# Patient Record
Sex: Male | Born: 1970 | Race: Black or African American | Hispanic: No | Marital: Single | Smoking: Never smoker
Health system: Southern US, Community
[De-identification: ages and names within clinical notes are randomized; demographics above are authoritative.]

## PROBLEM LIST (undated history)

## (undated) DIAGNOSIS — E039 Hypothyroidism, unspecified: Secondary | ICD-10-CM

## (undated) DIAGNOSIS — I10 Essential (primary) hypertension: Secondary | ICD-10-CM

## (undated) DIAGNOSIS — E119 Type 2 diabetes mellitus without complications: Secondary | ICD-10-CM

---

## 2013-10-02 ENCOUNTER — Emergency Department: Payer: Self-pay | Admitting: Emergency Medicine

## 2013-10-02 LAB — CBC
HCT: 39.4 % — AB (ref 40.0–52.0)
HGB: 13.2 g/dL (ref 13.0–18.0)
MCH: 26.2 pg (ref 26.0–34.0)
MCHC: 33.5 g/dL (ref 32.0–36.0)
MCV: 78 fL — ABNORMAL LOW (ref 80–100)
PLATELETS: 223 10*3/uL (ref 150–440)
RBC: 5.04 10*6/uL (ref 4.40–5.90)
RDW: 17.2 % — ABNORMAL HIGH (ref 11.5–14.5)
WBC: 11.7 10*3/uL — AB (ref 3.8–10.6)

## 2013-10-02 LAB — BASIC METABOLIC PANEL
Anion Gap: 2 — ABNORMAL LOW (ref 7–16)
BUN: 11 mg/dL (ref 7–18)
CALCIUM: 9 mg/dL (ref 8.5–10.1)
CHLORIDE: 104 mmol/L (ref 98–107)
Co2: 33 mmol/L — ABNORMAL HIGH (ref 21–32)
Creatinine: 1 mg/dL (ref 0.60–1.30)
EGFR (Non-African Amer.): >60
GLUCOSE: 100 mg/dL — AB (ref 65–99)
OSMOLALITY: 277 (ref 275–301)
Potassium: 3.5 mmol/L (ref 3.5–5.1)
Sodium: 139 mmol/L (ref 136–145)

## 2013-10-02 LAB — TROPONIN I: Troponin-I: <0.02 ng/mL

## 2013-10-09 ENCOUNTER — Emergency Department: Payer: Self-pay | Admitting: Emergency Medicine

## 2013-10-09 LAB — BASIC METABOLIC PANEL
Anion Gap: 7 (ref 7–16)
BUN: 9 mg/dL (ref 7–18)
CHLORIDE: 104 mmol/L (ref 98–107)
CO2: 31 mmol/L (ref 21–32)
Calcium, Total: 8.8 mg/dL (ref 8.5–10.1)
Creatinine: 1.01 mg/dL (ref 0.60–1.30)
EGFR (African American): >60
Glucose: 95 mg/dL (ref 65–99)
OSMOLALITY: 282 (ref 275–301)
Potassium: 4 mmol/L (ref 3.5–5.1)
Sodium: 142 mmol/L (ref 136–145)

## 2013-10-09 LAB — CBC
HCT: 41 % (ref 40.0–52.0)
HGB: 13 g/dL (ref 13.0–18.0)
MCH: 24.6 pg — AB (ref 26.0–34.0)
MCHC: 31.6 g/dL — ABNORMAL LOW (ref 32.0–36.0)
MCV: 78 fL — AB (ref 80–100)
PLATELETS: 230 10*3/uL (ref 150–440)
RBC: 5.27 10*6/uL (ref 4.40–5.90)
RDW: 16.9 % — ABNORMAL HIGH (ref 11.5–14.5)
WBC: 10.5 10*3/uL (ref 3.8–10.6)

## 2013-10-09 LAB — TROPONIN I
Troponin-I: <0.02 ng/mL
Troponin-I: <0.02 ng/mL

## 2013-10-26 ENCOUNTER — Ambulatory Visit: Payer: Self-pay | Admitting: Physician Assistant

## 2013-11-05 ENCOUNTER — Ambulatory Visit: Payer: Self-pay | Admitting: Internal Medicine

## 2014-11-18 ENCOUNTER — Emergency Department
Admit: 2014-11-18 | Disposition: A | Discharge status: Home / Self Care | Payer: Self-pay | Admitting: Emergency Medicine

## 2014-11-18 LAB — TROPONIN I: Troponin-I: <0.03 ng/mL

## 2014-11-18 LAB — COMPREHENSIVE METABOLIC PANEL
ALK PHOS: 64 U/L
ALT: 27 U/L
Albumin: 4.3 g/dL
Anion Gap: 7 (ref 7–16)
BILIRUBIN TOTAL: 1.2 mg/dL
BUN: 11 mg/dL
Calcium, Total: 9.3 mg/dL
Chloride: 103 mmol/L
Co2: 30 mmol/L
Creatinine: 0.96 mg/dL
EGFR (African American): >60
EGFR (Non-African Amer.): >60
GLUCOSE: 113 mg/dL — AB
Potassium: 3.9 mmol/L
SGOT(AST): 32 U/L
SODIUM: 140 mmol/L
Total Protein: 7.5 g/dL

## 2014-11-18 LAB — CBC
HCT: 41.8 % (ref 40.0–52.0)
HGB: 13.6 g/dL (ref 13.0–18.0)
MCH: 25.3 pg — AB (ref 26.0–34.0)
MCHC: 32.6 g/dL (ref 32.0–36.0)
MCV: 78 fL — ABNORMAL LOW (ref 80–100)
Platelet: 220 10*3/uL (ref 150–440)
RBC: 5.38 10*6/uL (ref 4.40–5.90)
RDW: 17.9 % — ABNORMAL HIGH (ref 11.5–14.5)
WBC: 9.1 10*3/uL (ref 3.8–10.6)

## 2014-11-18 IMAGING — CR DG CHEST 2V
1 series · 2 of 2 positions shown · non-contrast
Comparison: October 02, 2013

CLINICAL DATA: Chest pain

EXAM:
CHEST  2 VIEW

[Series 1: w chest pa · 0.14mm/px · 2 of 2 slices shown]
[im 1/2]
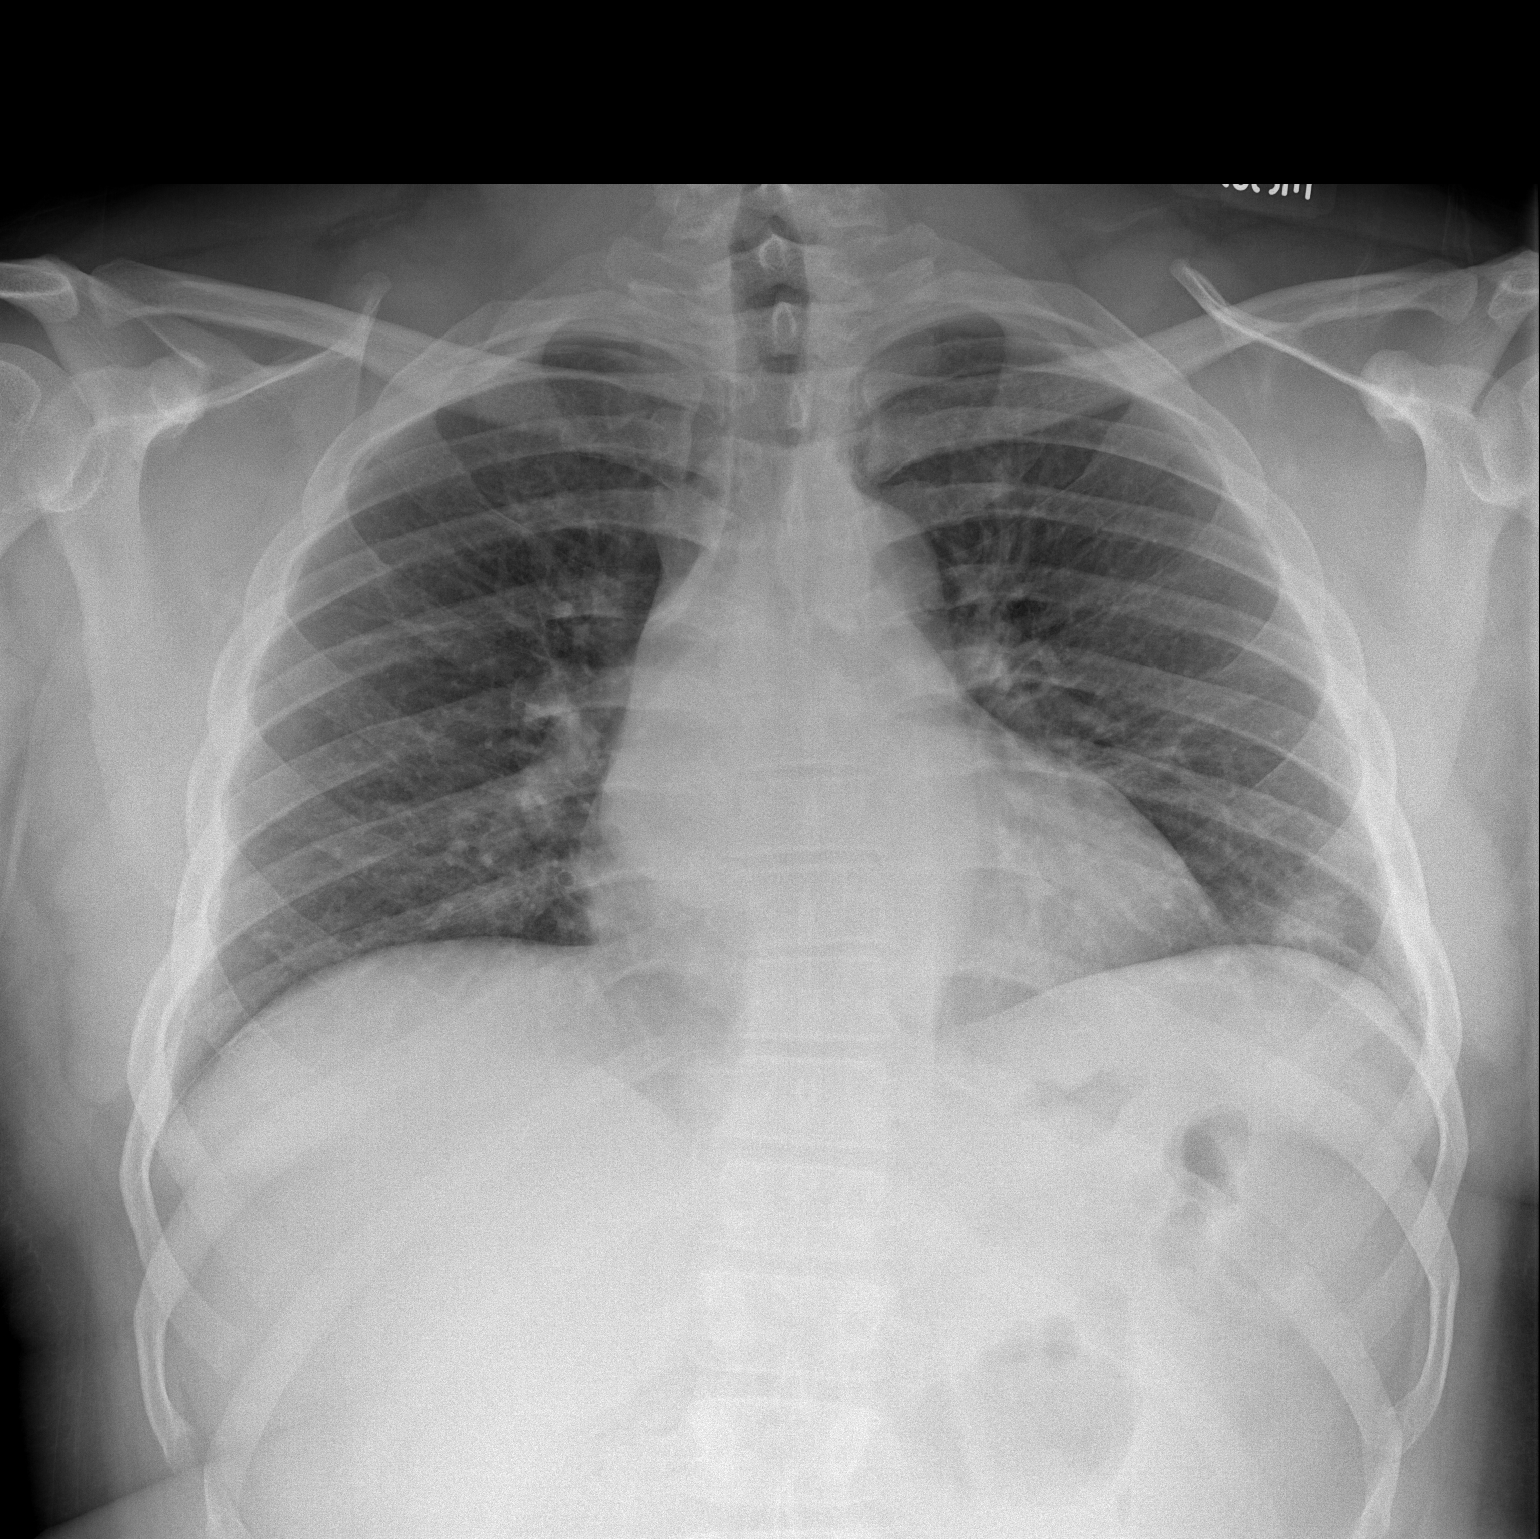
[im 2/2]
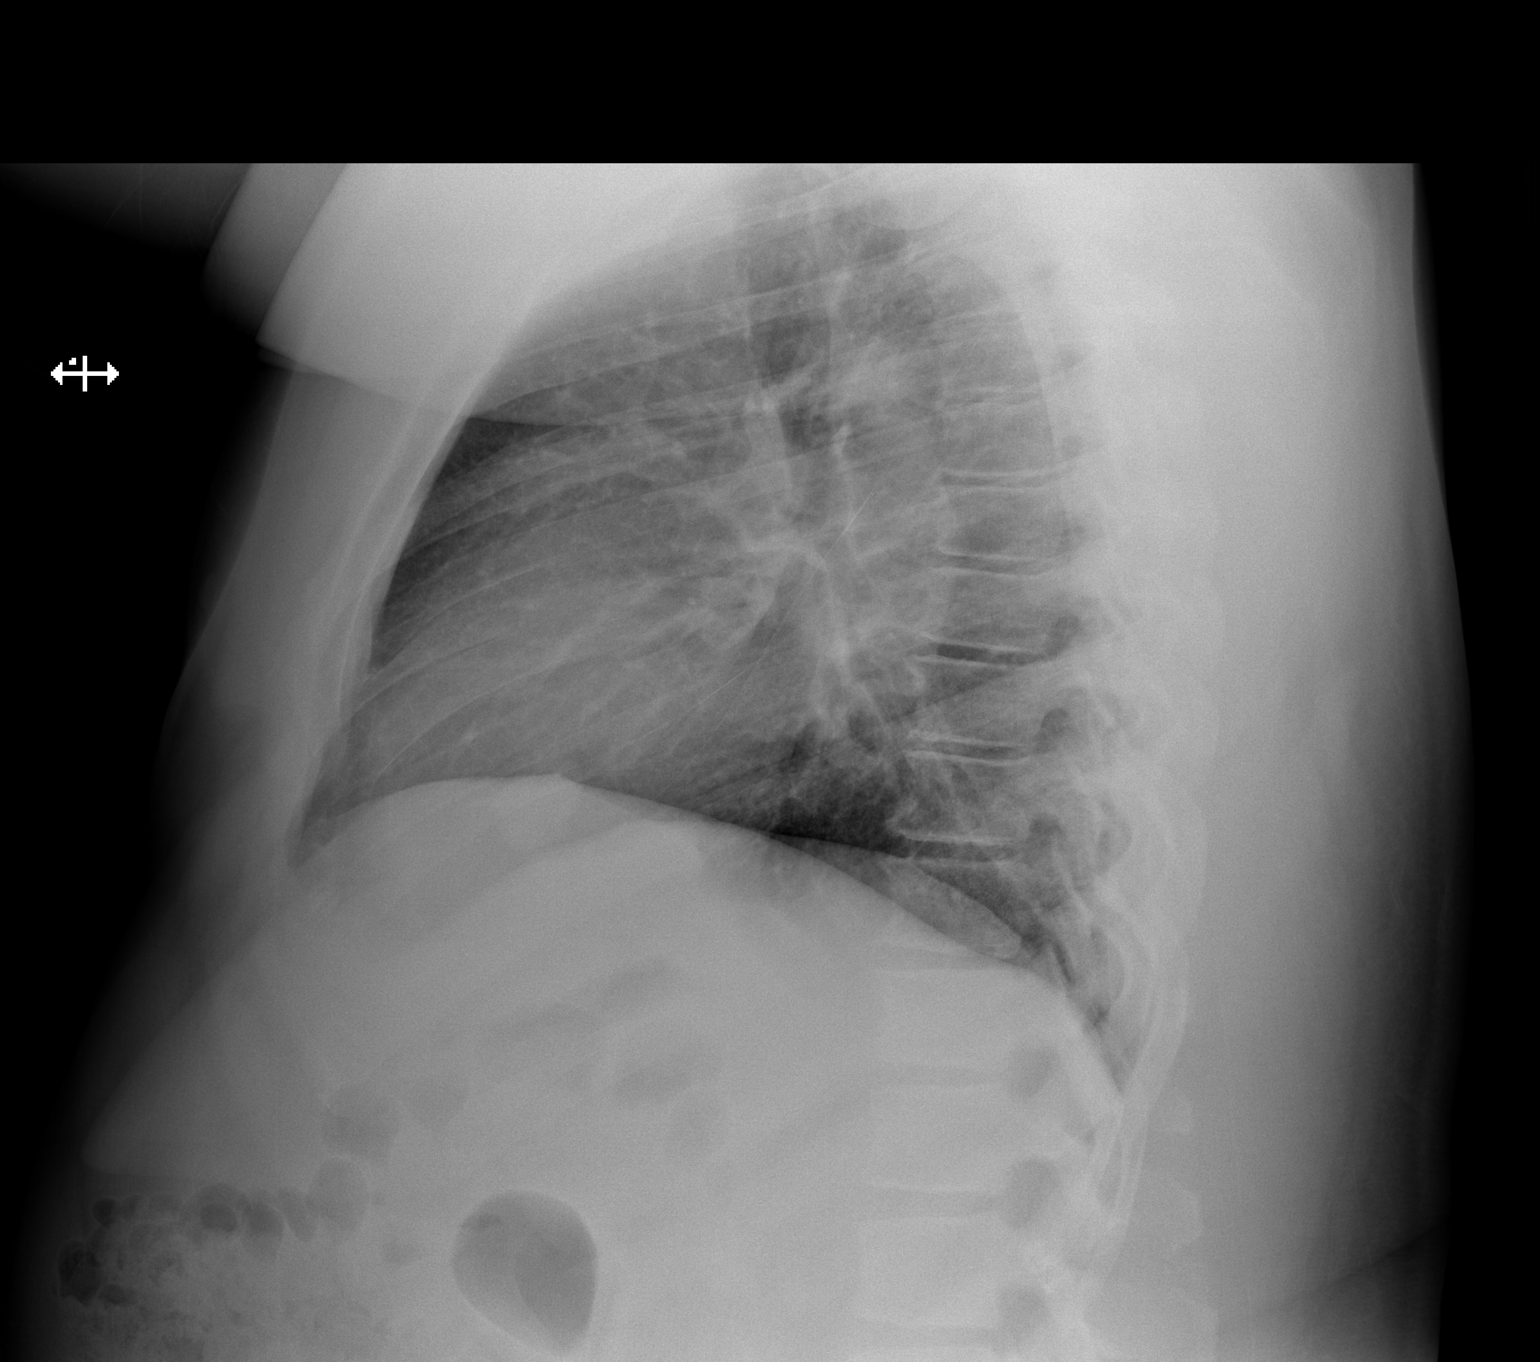

[2 of 2 positions shown; findings below may reference images not displayed]

FINDINGS: The heart size and mediastinal contours are stable. The aorta is
tortuous. There is a 1.8 x 2.2 cm mass in the left lung base. There
is no focal infiltrate, pulmonary edema, or pleural effusion. The
visualized skeletal structures are stable P
IMPRESSION: No active cardiopulmonary disease. 1.8 x 2.2 cm mass in the left
lung base, further evaluation with a chest CT on outpatient basis is
recommended.

## 2014-11-18 IMAGING — CT CT ANGIO CHEST
2 of 6 series · 17 of 46 positions shown · IV contrast (isovue)
Comparison: Chest radiograph 10/09/2013

CLINICAL DATA: 42-year-old with left chest pain.

EXAM:
CT ANGIOGRAPHY CHEST WITH CONTRAST
TECHNIQUE: Multidetector CT imaging of the chest was performed using the
standard protocol during bolus administration of intravenous
contrast. Multiplanar CT image reconstructions and MIPs were
obtained to evaluate the vascular anatomy.
CONTRAST:  100 mL Isovue 370

[Series 5: pe thins 1.5 · axial · 0.68mm/px · z∈[-477,-219]mm · 14 of 241 slices shown]
[im 13/241  lung]
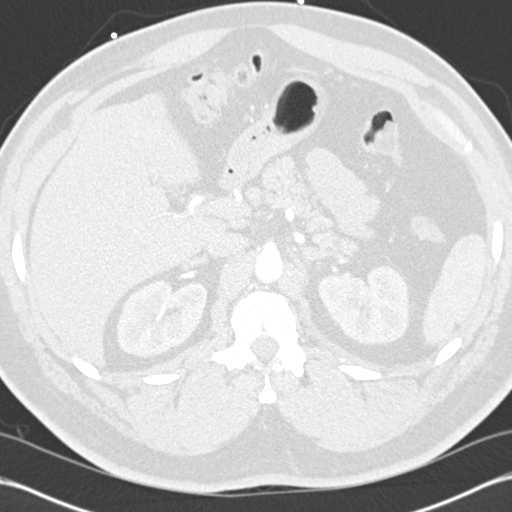
[im 26/241  soft-tissue]
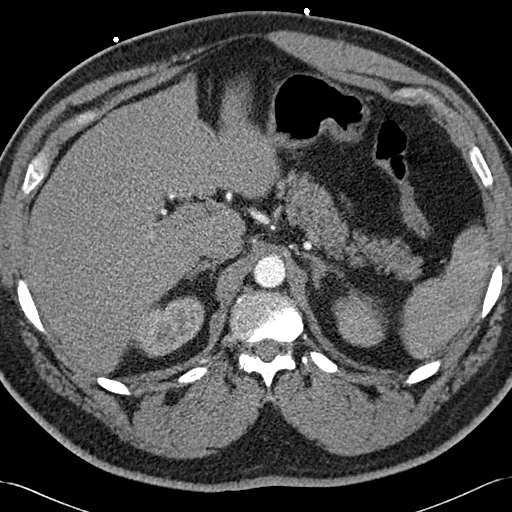
[im 51/241  lung]
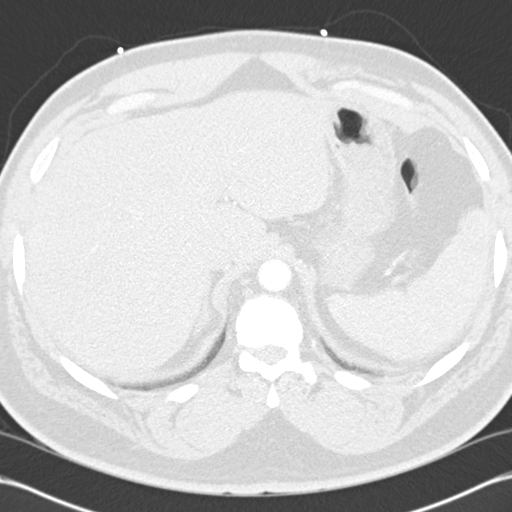
[im 64/241  soft-tissue]
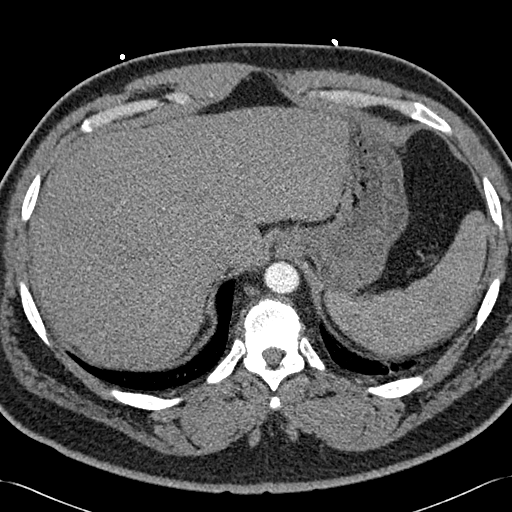
[im 76/241  lung]
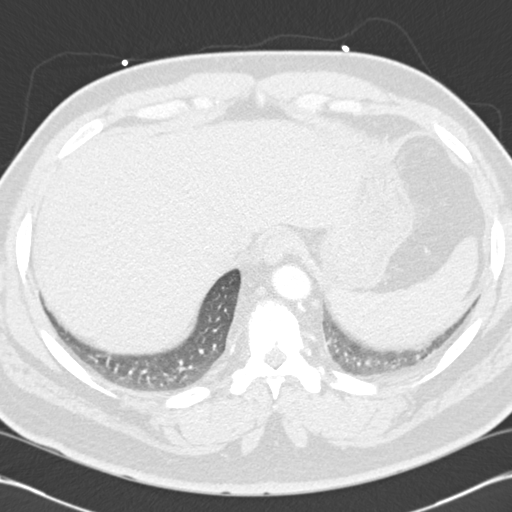
[im 102/241  soft-tissue]
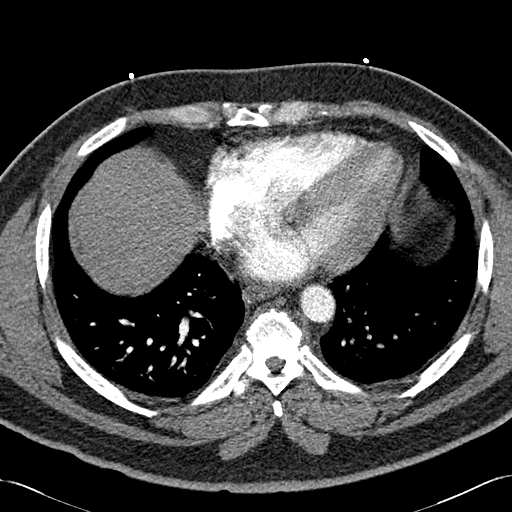
[im 114/241  lung]
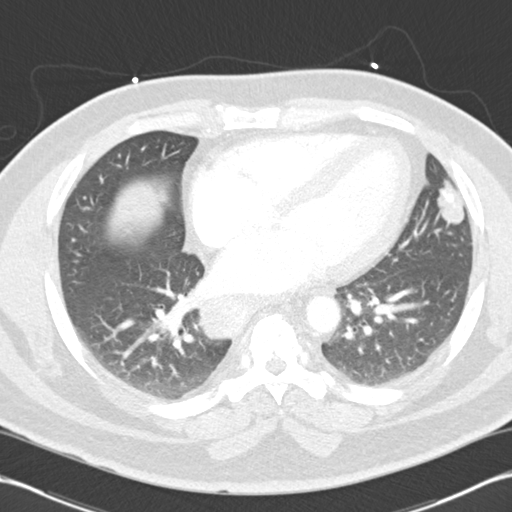
[im 127/241  soft-tissue]
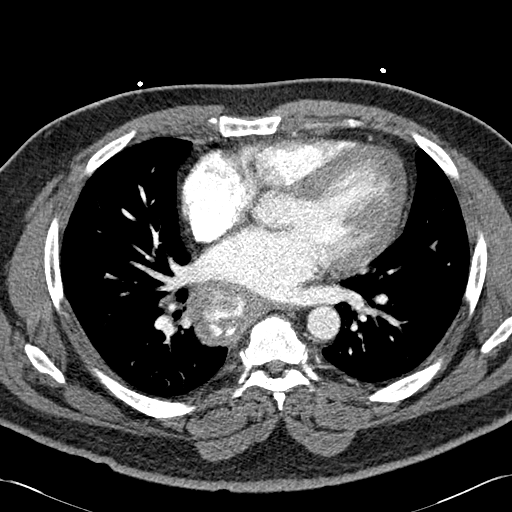
[im 139/241  lung]
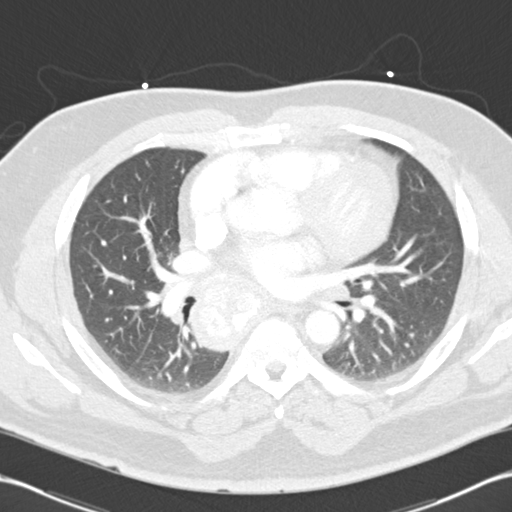
[im 165/241  soft-tissue]
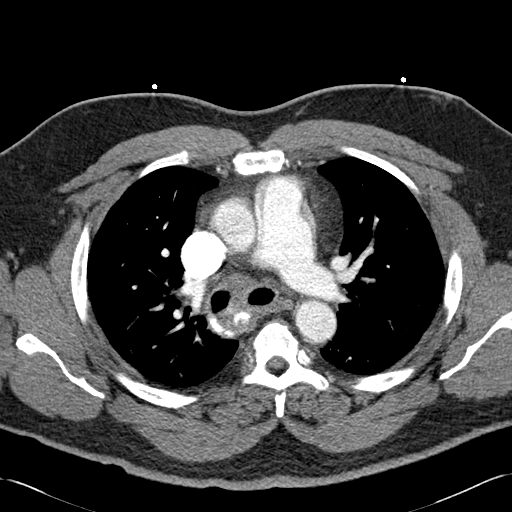
[im 177/241  lung]
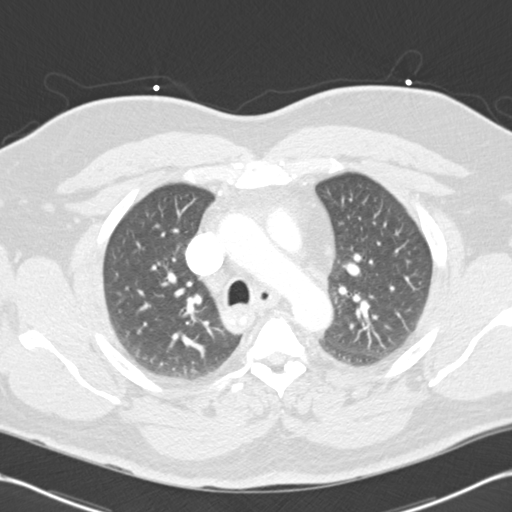
[im 190/241  soft-tissue]
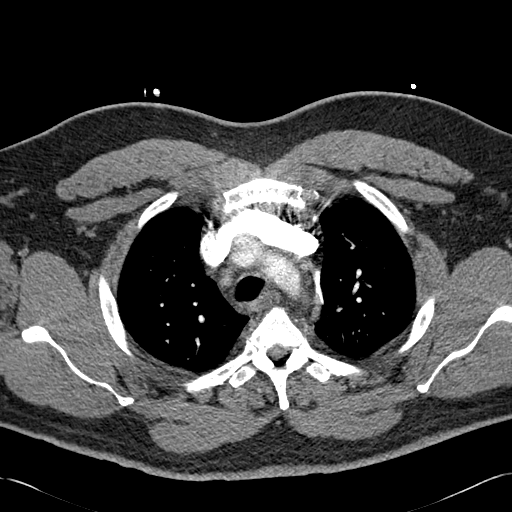
[im 215/241  lung]
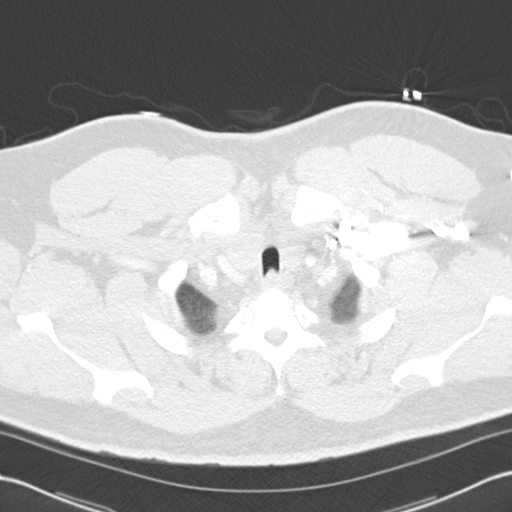
[im 228/241  soft-tissue]
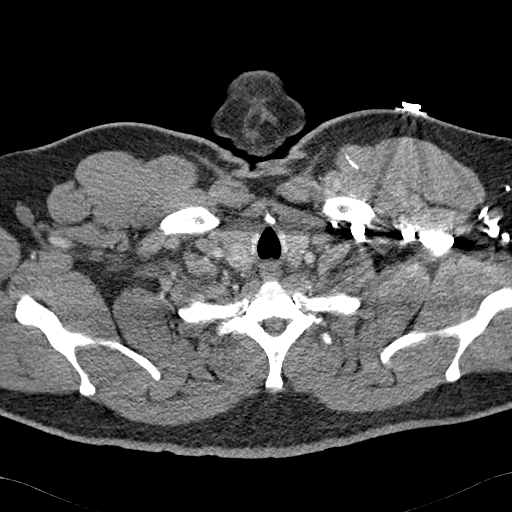

[Series 7: cor mpr 2.0 · coronal · 0.59mm/px · 3 of 141 slices shown]
[im 36/141  soft-tissue]
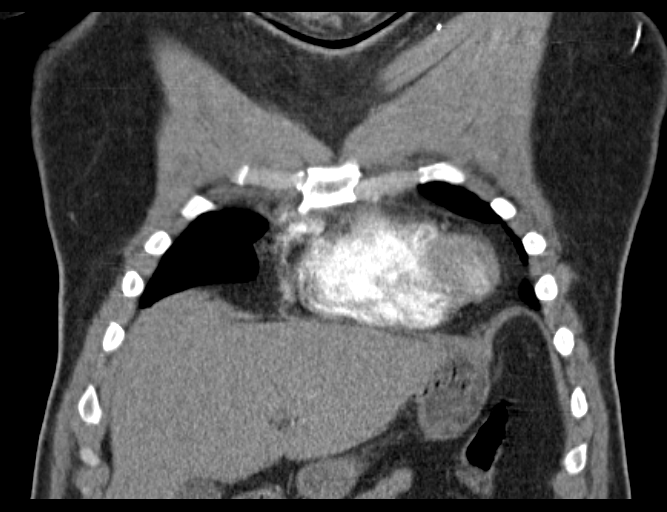
[im 71/141  soft-tissue]
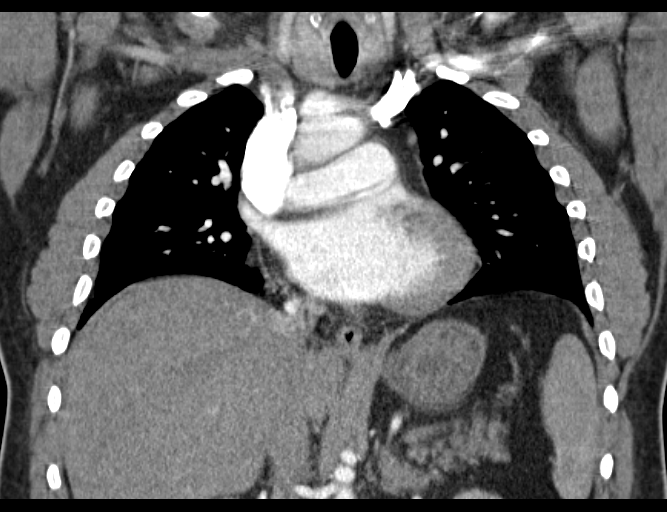
[im 106/141  soft-tissue]
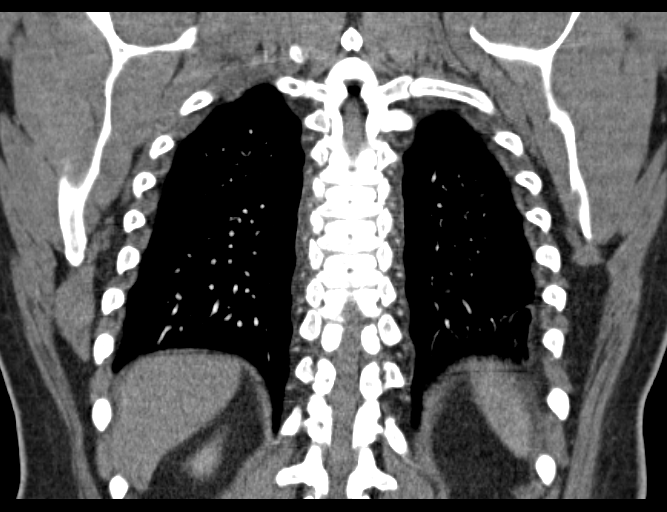

[17 of 46 positions shown; findings below may reference images not displayed]

FINDINGS: No evidence for pulmonary embolism. Incidentally, the patient has a
bovine type arch with a common origin of the right innominate artery
and left common carotid artery. Normal caliber of the thoracic aorta
without dissection. Images of the upper abdomen are unremarkable.
Normal appearance of the adrenal glands.

There is large subcarinal lesion that extends into the right lower
chest. This subcarinal lesion measures 4.6 cm in the short axis and
has diffuse central densities thought to represent calcifications.
This partially calcified lesion is quite large and measures 8.4 cm
in the craniocaudal dimension. Unable to distinguish the esophagus
from this lesion in the subcarinal region and it is possible that
this structure is associated with or adherent to the esophagus. This
lesion has mass effect and extrinsic compression on the left atrium.
Normal appearance of the distal esophagus. There are a few small
partially calcified nodes in the right paratracheal region.

The trachea and mainstem bronchi are patent. 1 or 2 small
calcifications in the anterior right lung on image number 57 and 52,
sequence 6. Patchy densities at the left lung base are suggestive
for atelectasis. There is a 2.4 cm nodular structure in the lingula
with small eccentric calcifications. No other suspicious pulmonary
nodules. No acute bone abnormality.

Review of the MIP images confirms the above findings.
IMPRESSION: There is a large mediastinal lesion situated in the subcarinal
space. This lesion appears to contain multiple central
calcifications. Lesion measures up to 8.4 cm in size. There is also
a 2.4 cm nodule in the lingula with a few eccentric calcifications.
Findings could represent old granulomatous disease but the size of
the mediastinal lesion and left pulmonary nodule are atypical.
Adriane disease would be in the differential diagnosis for the
mediastinal lesion. A neoplastic process is thought to be less
likely based on the morphology of these lesions but cannot be
completely excluded. The mediastinal lesion is also in close
proximity with the lower esophagus and unclear if these structures
are associated with each other. Recommend pulmonary consultation.

No evidence for a pulmonary embolism.

These results were called by telephone at the time of interpretation
on 10/09/2013 at [DATE] to Dr. CHAI TIGER , who verbally
acknowledged these results.

## 2015-09-20 ENCOUNTER — Emergency Department: Payer: Self-pay

## 2015-09-20 ENCOUNTER — Emergency Department
Admission: EM | Admit: 2015-09-20 | Discharge: 2015-09-20 | Disposition: A | Discharge status: Home / Self Care | Payer: Self-pay | Attending: Emergency Medicine | Admitting: Emergency Medicine

## 2015-09-20 ENCOUNTER — Encounter: Payer: Self-pay | Admitting: Emergency Medicine

## 2015-09-20 DIAGNOSIS — I10 Essential (primary) hypertension: Secondary | ICD-10-CM | POA: Insufficient documentation

## 2015-09-20 DIAGNOSIS — R0789 Other chest pain: Secondary | ICD-10-CM | POA: Insufficient documentation

## 2015-09-20 HISTORY — DX: Essential (primary) hypertension: I10

## 2015-09-20 LAB — BASIC METABOLIC PANEL
ANION GAP: 6 (ref 5–15)
BUN: 10 mg/dL (ref 6–20)
CALCIUM: 9.5 mg/dL (ref 8.9–10.3)
CO2: 33 mmol/L — AB (ref 22–32)
Chloride: 101 mmol/L (ref 101–111)
Creatinine, Ser: 1.07 mg/dL (ref 0.61–1.24)
GFR calc non Af Amer: >60 mL/min (ref >60)
Glucose, Bld: 121 mg/dL — ABNORMAL HIGH (ref 65–99)
Potassium: 3.4 mmol/L — ABNORMAL LOW (ref 3.5–5.1)
Sodium: 140 mmol/L (ref 135–145)

## 2015-09-20 LAB — CBC
HCT: 40 % (ref 40.0–52.0)
Hemoglobin: 13.3 g/dL (ref 13.0–18.0)
MCH: 25.4 pg — ABNORMAL LOW (ref 26.0–34.0)
MCHC: 33.2 g/dL (ref 32.0–36.0)
MCV: 76.6 fL — ABNORMAL LOW (ref 80.0–100.0)
PLATELETS: 237 10*3/uL (ref 150–440)
RBC: 5.23 MIL/uL (ref 4.40–5.90)
RDW: 16.9 % — AB (ref 11.5–14.5)
WBC: 9.6 10*3/uL (ref 3.8–10.6)

## 2015-09-20 LAB — TROPONIN I: Troponin I: 0.03 ng/mL (ref <0.031)

## 2015-09-20 MED ORDER — NAPROXEN 500 MG PO TABS: 500.0000 mg | ORAL_TABLET | Freq: Two times a day (BID) | ORAL | Status: DC

## 2015-09-20 NOTE — ED Notes (Signed)
Labs and CXR reviewed.

## 2015-09-20 NOTE — ED Provider Notes (Signed)
[facility] Emergency Department Provider Note  ____________________________________________  Time seen: 2:50 PM  I have reviewed the triage vital signs and the nursing notes.   HISTORY  Chief Complaint Chest Pain    HPI Larry Nixon is a 45 y.o. male who complains of mid chest tightness that lasted for 5 minutes starting at 10 AM today. He was using a sandblaster at the time which is not considered to be very strenuous when the pain developed. It was nonradiating, no diaphoresis nausea or vomiting. No dizziness. He did feel slightly short of breath during the time. He denies any recent exertional or pleuritic symptoms. No medical history, family history negative for early CAD, no alcohol or drug use or smoking. No recurrence of the pain since 10 AM. He does report that the pain was worsened by moving his arm and changing position.     Past Medical History  Diagnosis Date  . Hypertension      There are no active problems to display for this patient.    History reviewed. No pertinent past surgical history.   Current Outpatient Rx  Name  Route  Sig  Dispense  Refill  . naproxen (NAPROSYN) 500 MG tablet   Oral   Take 1 tablet (500 mg total) by mouth 2 (two) times daily with a meal.   20 tablet   0      Allergies Review of patient's allergies indicates no known allergies.   History reviewed. No pertinent family history.  Social History Social History  Substance Use Topics  . Smoking status: Never Smoker   . Smokeless tobacco: None  . Alcohol Use: No    Review of Systems  Constitutional:   No fever or chills. No weight changes Eyes:   No blurry vision or double vision.  ENT:   No sore throat. Cardiovascular:   Positive as above chest pain. Respiratory:   No dyspnea or cough. Gastrointestinal:   Negative for abdominal pain, vomiting and diarrhea.  No BRBPR or melena. Genitourinary:   Negative for dysuria, urinary retention, bloody  urine, or difficulty urinating. Musculoskeletal:   Negative for back pain. No joint swelling or pain. Skin:   Negative for rash. Neurological:   Negative for headaches, focal weakness or numbness. Psychiatric:  No anxiety or depression.   Endocrine:  No hot/cold intolerance, changes in energy, or sleep difficulty.  10-point ROS otherwise negative.  ____________________________________________   PHYSICAL EXAM:  VITAL SIGNS: ED Triage Vitals  Enc Vitals Group     BP 09/20/15 1118 138/88 mmHg     Pulse Rate 09/20/15 1118 86     Resp 09/20/15 1118 20     Temp 09/20/15 1118 97.8 F (36.6 C)     Temp Source 09/20/15 1118 Oral     SpO2 09/20/15 1118 99 %     Weight 09/20/15 1118 270 lb (122.471 kg)     Height 09/20/15 1118  (1.702 m)     Head Cir --      Peak Flow --      Pain Score 09/20/15 1118 0     Pain Loc --      Pain Edu? --      Excl. in GC? --     Vital signs reviewed, nursing assessments reviewed.   Constitutional:   Alert and oriented. Well appearing and in no distress. Eyes:   No scleral icterus. No conjunctival pallor. PERRL. EOMI ENT   Head:   Normocephalic and atraumatic.  Nose:   No congestion/rhinnorhea. No septal hematoma   Mouth/Throat:   MMM, no pharyngeal erythema. No peritonsillar mass. No uvula shift.   Neck:   No stridor. No SubQ emphysema. No meningismus. Hematological/Lymphatic/Immunilogical:   No cervical lymphadenopathy. Cardiovascular:   RRR. Normal and symmetric distal pulses are present in all extremities. No murmurs, rubs, or gallops. Respiratory:   Normal respiratory effort without tachypnea nor retractions. Breath sounds are clear and equal bilaterally. No wheezes/rales/rhonchi. Gastrointestinal:   Soft and nontender. No distention. There is no CVA tenderness.  No rebound, rigidity, or guarding. Genitourinary:   deferred Musculoskeletal:   Nontender with normal range of motion in all extremities. No joint effusions.  No  lower extremity tenderness.  No edema. Neurologic:   Normal speech and language.  CN 2-10 normal. Motor grossly intact. No pronator drift.  Normal gait. No gross focal neurologic deficits are appreciated.  Skin:    Skin is warm, dry and intact. No rash noted.  No petechiae, purpura, or bullae. Psychiatric:   Mood and affect are normal. Speech and behavior are normal. Patient exhibits appropriate insight and judgment.  ____________________________________________    LABS (pertinent positives/negatives) (all labs ordered are listed, but only abnormal results are displayed) Labs Reviewed  BASIC METABOLIC PANEL - Abnormal; Notable for the following:    Potassium 3.4 (*)    CO2 33 (*)    Glucose, Bld 121 (*)    All other components within normal limits  CBC - Abnormal; Notable for the following:    MCV 76.6 (*)    MCH 25.4 (*)    RDW 16.9 (*)    All other components within normal limits  TROPONIN I  TROPONIN I   ____________________________________________   EKG  Interpreted by me Normal sinus rhythm rate of 81, normal axis and intervals. Normal QRS and ST segments. There is an isolated T-wave inversion in V6 per this is unchanged from prior EKG on 10/09/2013  ____________________________________________    RADIOLOGY  Chest x-ray unremarkable  ____________________________________________   PROCEDURES   ____________________________________________   INITIAL IMPRESSION / ASSESSMENT AND PLAN / ED COURSE  Pertinent labs & imaging results that were available during my care of the patient were reviewed by me and considered in my medical decision making (see chart for details).  Patient presents with chest pain. This pain is atypical. Considering the patient's symptoms, medical history, and physical examination today, I have low suspicion for ACS, PE, TAD, pneumothorax, carditis, mediastinitis, pneumonia, CHF, or sepsis.  Although the pain is not reproducible on exam,  I think that the symptoms as he describes them are very much consistent with a musculoskeletal or chest wall pain. He is very calm and comfortable and well-appearing. Troponin is negative 2 even more than 6 hours out from the onset of the symptoms. We'll have the patient follow up with his primary care doctor and take NSAIDs as needed.     ____________________________________________   FINAL CLINICAL IMPRESSION(S) / ED DIAGNOSES  Final diagnoses:  Acute chest wall pain      Sharman Cheek, MD 09/20/15 1534

## 2015-09-20 NOTE — ED Notes (Signed)
Pt to ed with c/o chest pain in left side of chest that lasted about 5 min this am.  Pt states he felt sob, dizziness, and weakness during the episode although denies all symptoms now.  Pt alert and oriented and skin warm and dry.

## 2015-09-20 NOTE — Discharge Instructions (Signed)

## 2015-09-20 NOTE — ED Notes (Signed)
EKG was done at 1119 in triage area, order was not checked off until 1407 by me.

## 2016-06-26 ENCOUNTER — Emergency Department
Admission: EM | Admit: 2016-06-26 | Discharge: 2016-06-26 | Disposition: A | Discharge status: Home / Self Care | Payer: Self-pay | Attending: Emergency Medicine | Admitting: Emergency Medicine

## 2016-06-26 ENCOUNTER — Emergency Department: Payer: Self-pay

## 2016-06-26 DIAGNOSIS — R42 Dizziness and giddiness: Secondary | ICD-10-CM | POA: Insufficient documentation

## 2016-06-26 DIAGNOSIS — M62838 Other muscle spasm: Secondary | ICD-10-CM | POA: Insufficient documentation

## 2016-06-26 DIAGNOSIS — I10 Essential (primary) hypertension: Secondary | ICD-10-CM | POA: Insufficient documentation

## 2016-06-26 DIAGNOSIS — Z79899 Other long term (current) drug therapy: Secondary | ICD-10-CM | POA: Insufficient documentation

## 2016-06-26 DIAGNOSIS — R252 Cramp and spasm: Secondary | ICD-10-CM

## 2016-06-26 DIAGNOSIS — Z791 Long term (current) use of non-steroidal anti-inflammatories (NSAID): Secondary | ICD-10-CM | POA: Insufficient documentation

## 2016-06-26 LAB — CBC WITH DIFFERENTIAL/PLATELET
BASOS ABS: 0 10*3/uL (ref 0–0.1)
Basophils Relative: 0 %
EOS ABS: 0.1 10*3/uL (ref 0–0.7)
EOS PCT: 1 %
HCT: 43.6 % (ref 40.0–52.0)
Hemoglobin: 14.6 g/dL (ref 13.0–18.0)
Lymphocytes Relative: 25 %
Lymphs Abs: 2.6 10*3/uL (ref 1.0–3.6)
MCH: 26.6 pg (ref 26.0–34.0)
MCHC: 33.4 g/dL (ref 32.0–36.0)
MCV: 79.8 fL — ABNORMAL LOW (ref 80.0–100.0)
MONO ABS: 0.8 10*3/uL (ref 0.2–1.0)
Monocytes Relative: 8 %
NEUTROS ABS: 7 10*3/uL — AB (ref 1.4–6.5)
Neutrophils Relative %: 66 %
PLATELETS: 238 10*3/uL (ref 150–440)
RBC: 5.47 MIL/uL (ref 4.40–5.90)
RDW: 16.6 % — AB (ref 11.5–14.5)
WBC: 10.6 10*3/uL (ref 3.8–10.6)

## 2016-06-26 LAB — COMPREHENSIVE METABOLIC PANEL
ALT: 39 U/L (ref 17–63)
ANION GAP: 8 (ref 5–15)
AST: 40 U/L (ref 15–41)
Albumin: 4.7 g/dL (ref 3.5–5.0)
Alkaline Phosphatase: 79 U/L (ref 38–126)
BUN: 15 mg/dL (ref 6–20)
CO2: 30 mmol/L (ref 22–32)
Calcium: 9.5 mg/dL (ref 8.9–10.3)
Chloride: 100 mmol/L — ABNORMAL LOW (ref 101–111)
Creatinine, Ser: 1 mg/dL (ref 0.61–1.24)
GFR calc Af Amer: >60 mL/min (ref >60)
GFR calc non Af Amer: >60 mL/min (ref >60)
Glucose, Bld: 155 mg/dL — ABNORMAL HIGH (ref 65–99)
Potassium: 3.7 mmol/L (ref 3.5–5.1)
Sodium: 138 mmol/L (ref 135–145)
TOTAL PROTEIN: 8.2 g/dL — AB (ref 6.5–8.1)
Total Bilirubin: 1.1 mg/dL (ref 0.3–1.2)

## 2016-06-26 LAB — TROPONIN I

## 2016-06-26 NOTE — ED Provider Notes (Signed)
EKG reviewed and interpreted by me at 1010 Ventricular rate 80 QRS 90 QTc 4:30 Normal sinus rhythm, mild repolarization abnormality noted in V1 through V3, likely J-point/repolarization abnormality. No evidence of acute ST elevation MI. Compared with his previous EKG from 09/20/2015, anteroseptal repolarization abnormality again noted. No evidence of acute ischemia today.   Sharyn CreamerMark Anwyn Kriegel, MD 06/26/16 [phone]

## 2016-06-26 NOTE — Discharge Instructions (Signed)
Call and make an appointment [facility] with [facility] Continue your normal medication.

## 2016-06-26 NOTE — ED Provider Notes (Signed)
[facility] Emergency Department Provider Note  ____________________________________________   First MD Initiated Contact with Patient 06/26/16 1004     (approximate)  I have reviewed the triage vital signs and the nursing notes.   HISTORY  Chief Complaint Spasms   HPI Larry Nixon is a 45 y.o. male is her complaint of intermittent muscle spasms and cramps along with lightheadedness today. Patient states that he was bent over yesterday when he experienced spasms between his shoulder blades that lasted approximately 30 seconds. Today at work while he was doing "strenuous work" he became lightheaded. He denies any nausea, vomiting, diaphoresis or difficulty breathing. Patient states that he has not seen anybody for his muscle spasms and that this has been going on for longer than 10 years.He states that he generally sees Esperanza SheetsEric Turner PA[facility]C [facility] however today when he called to make an appointment he found out that his provider was no longer there. He was told he could have an appointment for Thursday or go to the emergency room.   Past Medical History:  Diagnosis Date  . Hypertension     There are no active problems to display for this patient.   History reviewed. No pertinent surgical history.  Prior to Admission medications   Medication Sig Start Date End Date Taking? Authorizing Provider  hydrochlorothiazide (HYDRODIURIL) 25 MG tablet Take 25 mg by mouth daily.   Yes Historical Provider, MD  lisinopril (PRINIVIL,ZESTRIL) 40 MG tablet Take 40 mg by mouth at bedtime.   Yes Historical Provider, MD  naproxen (NAPROSYN) 500 MG tablet Take 1 tablet (500 mg total) by mouth 2 (two) times daily with a meal. 09/20/15   Sharman CheekPhillip Stafford, MD    Allergies Patient has no known allergies.  No family history on file.  Social History Social History  Substance Use Topics  . Smoking status: Never Smoker  . Smokeless tobacco: Never Used  . Alcohol use No     Review of Systems Constitutional: No fever/chills Eyes: No visual changes. ENT: No sore throat. Cardiovascular: Denies chest pain. Respiratory: Denies shortness of breath. Gastrointestinal: No abdominal pain.  No nausea, no vomiting.  No diarrhea.  No constipation. Genitourinary: Negative for dysuria. Musculoskeletal: Negative for back pain. Intermittent infrequent muscle cramps generalized over her upper and lower extremities. Skin: Negative for rash. Neurological: Negative for headaches, focal weakness or numbness.  10-point ROS otherwise negative.  ____________________________________________   PHYSICAL EXAM:  VITAL SIGNS: ED Triage Vitals  Enc Vitals Group     BP 06/26/16 0943 (!) 143/91     Pulse Rate 06/26/16 0943 84     Resp 06/26/16 0943 17     Temp 06/26/16 0943 98.5 F (36.9 C)     Temp Source 06/26/16 0943 Oral     SpO2 06/26/16 0943 97 %     Weight 06/26/16 0944 260 lb (117.9 kg)     Height 06/26/16 0944 5\' 6"  (1.676 m)     Head Circumference --      Peak Flow --      Pain Score --      Pain Loc --      Pain Edu? --      Excl. in GC? --     Constitutional: Alert and oriented. Well appearing and in no acute distress. Eyes: Conjunctivae are normal. PERRL. EOMI. Head: Atraumatic. Nose: No congestion/rhinnorhea. Neck: No stridor.   Hematological/Lymphatic/Immunilogical: No cervical lymphadenopathy. Cardiovascular: Normal rate, regular rhythm. Grossly normal heart sounds.  Good  peripheral circulation. Respiratory: Normal respiratory effort.  No retractions. Lungs CTAB. Gastrointestinal: Soft and nontender. No distention.  Musculoskeletal: Moves upper and lower extremities without any difficulty. Normal gait was noted. There is no point tenderness on palpation of the cervical, thoracic or lumbar spine. Good muscle strength upper and lower extremities. Normal gait was noted. Neurologic:  Normal speech and language. No gross focal neurologic deficits are  appreciated. No gait instability. Reflexes were 2+ bilaterally. Skin:  Skin is warm, dry and intact. No rash noted. Psychiatric: Mood and affect are normal. Speech and behavior are normal.  ____________________________________________   LABS (all labs ordered are listed, but only abnormal results are displayed)  Labs Reviewed  COMPREHENSIVE METABOLIC PANEL - Abnormal; Notable for the following:       Result Value   Chloride 100 (*)    Glucose, Bld 155 (*)    Total Protein 8.2 (*)    All other components within normal limits  CBC WITH DIFFERENTIAL/PLATELET - Abnormal; Notable for the following:    MCV 79.8 (*)    RDW 16.6 (*)    Neutro Abs 7.0 (*)    All other components within normal limits  TROPONIN I   ____________________________________________  EKG  Per Dr. Fanny BienQuale ____________________________________________  RADIOLOGY  Chest x-ray per radiologist: IMPRESSION:  Lingular 2 cm nodule unchanged. Mediastinal mass again noted. No  superimposed acute abnormality.   I, Tommi Rumpshonda L Summers, personally viewed and evaluated these images (plain radiographs) as part of my medical decision making, as well as reviewing the written report by the radiologist. ____________________________________________   PROCEDURES  Procedure(s) performed: None  Procedures  Critical Care performed: No  ____________________________________________   INITIAL IMPRESSION / ASSESSMENT AND PLAN / ED COURSE  Pertinent labs & imaging results that were available during my care of the patient were reviewed by me and considered in my medical decision making (see chart for details).    Clinical Course    Patient was reassured that his lab work did not show any problems with his electrolytes but that there was no explanation for his chronic intermittent muscle spasms. Patient is call to confirm whether or not he truly does have an appointment on Thursday with the office where his primary provider  was. Patient is continue on his current medications as prescribed by his doctor. He is given a note to remain out of work the remainder of the day.  ____________________________________________   FINAL CLINICAL IMPRESSION(S) / ED DIAGNOSES  Final diagnoses:  Lightheadedness  Muscle cramps      NEW MEDICATIONS STARTED DURING THIS VISIT:  Discharge Medication List as of 06/26/2016 11:41 AM       Note:  This document was prepared using Dragon voice recognition software and may include unintentional dictation errors.    Tommi Rumpshonda L Summers, PA-C 06/26/16 1254    Governor Rooksebecca Lord, MD 06/26/16 [phone]

## 2016-06-26 NOTE — ED Notes (Signed)
Patient to xray.

## 2016-06-26 NOTE — ED Triage Notes (Signed)
Pt c/o intermittent muscle spasms, cramps for years.. States yesterday he bent over and had spasm, pain between shoulders lasting 30 seconds..Marland Kitchen

## 2016-08-09 ENCOUNTER — Emergency Department
Admission: EM | Admit: 2016-08-09 | Discharge: 2016-08-09 | Disposition: A | Discharge status: Home / Self Care | Payer: Self-pay | Attending: Emergency Medicine | Admitting: Emergency Medicine

## 2016-08-09 DIAGNOSIS — E119 Type 2 diabetes mellitus without complications: Secondary | ICD-10-CM | POA: Insufficient documentation

## 2016-08-09 DIAGNOSIS — Z79899 Other long term (current) drug therapy: Secondary | ICD-10-CM | POA: Insufficient documentation

## 2016-08-09 DIAGNOSIS — I10 Essential (primary) hypertension: Secondary | ICD-10-CM | POA: Insufficient documentation

## 2016-08-09 LAB — URINALYSIS, ROUTINE W REFLEX MICROSCOPIC
BACTERIA UA: NONE SEEN
Bilirubin Urine: NEGATIVE
Hgb urine dipstick: NEGATIVE
KETONES UR: 5 mg/dL — AB
LEUKOCYTES UA: NEGATIVE
Nitrite: NEGATIVE
PH: 5 (ref 5.0–8.0)
Protein, ur: NEGATIVE mg/dL
RBC / HPF: NONE SEEN RBC/hpf (ref 0–5)
SPECIFIC GRAVITY, URINE: 1.018 (ref 1.005–1.030)

## 2016-08-09 LAB — COMPREHENSIVE METABOLIC PANEL
ALBUMIN: 4.8 g/dL (ref 3.5–5.0)
ALK PHOS: 99 U/L (ref 38–126)
ALT: 30 U/L (ref 17–63)
ANION GAP: 13 (ref 5–15)
AST: 24 U/L (ref 15–41)
BILIRUBIN TOTAL: 1.5 mg/dL — AB (ref 0.3–1.2)
BUN: 27 mg/dL — ABNORMAL HIGH (ref 6–20)
CALCIUM: 9.5 mg/dL (ref 8.9–10.3)
CO2: 25 mmol/L (ref 22–32)
CREATININE: 1.54 mg/dL — AB (ref 0.61–1.24)
Chloride: 90 mmol/L — ABNORMAL LOW (ref 101–111)
GFR calc Af Amer: >60 mL/min (ref >60)
GFR calc non Af Amer: 53 mL/min — ABNORMAL LOW (ref >60)
GLUCOSE: 552 mg/dL — AB (ref 65–99)
Potassium: 4 mmol/L (ref 3.5–5.1)
Sodium: 128 mmol/L — ABNORMAL LOW (ref 135–145)
TOTAL PROTEIN: 8.3 g/dL — AB (ref 6.5–8.1)

## 2016-08-09 LAB — GLUCOSE, CAPILLARY
GLUCOSE-CAPILLARY: 528 mg/dL — AB (ref 65–99)
GLUCOSE-CAPILLARY: 346 mg/dL — AB (ref 65–99)
GLUCOSE-CAPILLARY: 274 mg/dL — AB (ref 65–99)
Glucose-Capillary: 444 mg/dL — ABNORMAL HIGH (ref 65–99)

## 2016-08-09 LAB — CBC
HEMATOCRIT: 43.9 % (ref 40.0–52.0)
HEMOGLOBIN: 15 g/dL (ref 13.0–18.0)
MCH: 26.5 pg (ref 26.0–34.0)
MCHC: 34.2 g/dL (ref 32.0–36.0)
MCV: 77.5 fL — ABNORMAL LOW (ref 80.0–100.0)
Platelets: 252 10*3/uL (ref 150–440)
RBC: 5.67 MIL/uL (ref 4.40–5.90)
RDW: 16.1 % — ABNORMAL HIGH (ref 11.5–14.5)
WBC: 9.9 10*3/uL (ref 3.8–10.6)

## 2016-08-09 MED ORDER — INSULIN ASPART 100 UNIT/ML ~~LOC~~ SOLN
10.0000 [IU] | Freq: Once | SUBCUTANEOUS | Status: AC
  Administered 2016-08-09: 10 [IU] via INTRAVENOUS

## 2016-08-09 MED ORDER — INSULIN ASPART 100 UNIT/ML ~~LOC~~ SOLN
SUBCUTANEOUS | Status: AC
  Filled 2016-08-09: qty 10

## 2016-08-09 MED ORDER — LIVING WELL WITH DIABETES BOOK
Freq: Once | Status: AC
  Administered 2016-08-09: 1
  Filled 2016-08-09: qty 1

## 2016-08-09 MED ORDER — METFORMIN HCL 1000 MG PO TABS: 1000.0000 mg | ORAL_TABLET | Freq: Two times a day (BID) | ORAL | 1 refills | Status: DC

## 2016-08-09 MED ORDER — SODIUM CHLORIDE 0.9 % IV BOLUS (SEPSIS)
1000.0000 mL | Freq: Once | INTRAVENOUS | Status: AC
  Administered 2016-08-09: 1000 mL via INTRAVENOUS

## 2016-08-09 MED ORDER — INSULIN ASPART 100 UNIT/ML ~~LOC~~ SOLN
SUBCUTANEOUS | Status: AC
  Filled 2016-08-09: qty 4

## 2016-08-09 MED ORDER — INSULIN ASPART 100 UNIT/ML ~~LOC~~ SOLN
4.0000 [IU] | Freq: Once | SUBCUTANEOUS | Status: AC
Start: 2016-08-09 — End: 2016-08-09
  Administered 2016-08-09: 4 [IU] via INTRAVENOUS

## 2016-08-09 NOTE — ED Notes (Signed)
Pt given diabetic handbook and diabetic coordinator is in room with pt at this discussing diabetic education

## 2016-08-09 NOTE — ED Notes (Signed)
Pt reports that he feels dehydrated and is voiding excessively for the last 2-3 weeks - c/o feeling tired - MD has already evaluated pt - CBG 528 in triage

## 2016-08-09 NOTE — ED Provider Notes (Signed)
[facility] Emergency Department Provider Note  Time seen: 11:29 AM  I have reviewed the triage vital signs and the nursing notes.   HISTORY  Chief Complaint Polydipsia and Urinary Frequency    HPI Larry Nixon is a 45 y.o. male with a past medical history of hypertension who presents to the emergency department with increased thirst and increased urination over the past 2-3 weeks. According to the patient the past 2-3 weeks she has been urinating very frequently, states he is constantly thirsty no matter how much she drinks. Patient states he has been feeling so bad he made an appointment with his primary care doctor tomorrow, however at work today he was feeling very poorly very tired so he came to the ER for evaluation. Upon arrival the patient is a blood glucose of 528, no personal or family history of diabetes known to the patient. Patient denies any other medical complaints, denies any chest pain abdominal pain, nausea vomiting or diarrhea.  Past Medical History:  Diagnosis Date  . Hypertension     There are no active problems to display for this patient.   History reviewed. No pertinent surgical history.  Prior to Admission medications   Medication Sig Start Date End Date Taking? Authorizing Provider  hydrochlorothiazide (HYDRODIURIL) 25 MG tablet Take 25 mg by mouth daily.    Historical Provider, MD  lisinopril (PRINIVIL,ZESTRIL) 40 MG tablet Take 40 mg by mouth at bedtime.    Historical Provider, MD  naproxen (NAPROSYN) 500 MG tablet Take 1 tablet (500 mg total) by mouth 2 (two) times daily with a meal. 09/20/15   Sharman CheekPhillip Stafford, MD    No Known Allergies  No family history on file.  Social History Social History  Substance Use Topics  . Smoking status: Never Smoker  . Smokeless tobacco: Never Used  . Alcohol use No    Review of Systems Constitutional: Negative for fever. Cardiovascular: Negative for chest pain. Respiratory: Negative for  shortness of breath. Gastrointestinal: Negative for abdominal pain Genitourinary: Negative for dysuria.Positive for urinary frequency. Neurological: Negative for headache 10-point ROS otherwise negative.  ____________________________________________   PHYSICAL EXAM:  VITAL SIGNS: ED Triage Vitals [08/09/16 1113]  Enc Vitals Group     BP 110/75     Pulse Rate 97     Resp 18     Temp 98.6 F (37 C)     Temp Source Oral     SpO2 100 %     Weight 250 lb (113.4 kg)     Height 5\' 6"  (1.676 m)     Head Circumference      Peak Flow      Pain Score      Pain Loc      Pain Edu?      Excl. in GC?    Constitutional: Alert and oriented. Well appearing and in no distress. Eyes: Normal exam ENT   Head: Normocephalic and atraumatic   Mouth/Throat: Mucous membranes are moist. Cardiovascular: Normal rate, regular rhythm. No murmur Respiratory: Normal respiratory effort without tachypnea nor retractions. Breath sounds are clear  Gastrointestinal: Soft and nontender. No distention.   Musculoskeletal: Nontender with normal range of motion in all extremities.  Neurologic:  Normal speech and language. No gross focal neurologic deficits Skin:  Skin is warm, dry and intact.  Psychiatric: Mood and affect are normal.   ____________________________________________   INITIAL IMPRESSION / ASSESSMENT AND PLAN / ED COURSE  Pertinent labs & imaging results that were available during  my care of the patient were reviewed by me and considered in my medical decision making (see chart for details).  The patient presents the emergency department with increased thirst and increased urinary frequency over the past 2-3 weeks. Patient's blood glucose of 528 upon arrival. It appears the patient's symptoms are most consistent with new onset diabetes. We will check labs, IV hydrate close monitoring in the emergency department. The patient has a primary care doctor appointment tomorrow with Dr.  Mayford Knifeurner.   Patient's labs are largely within normal limits besides an elevated blood glucose, and signs of mild dehydration. Patient's blood glucose now currently 274. Diabetic nurse coordinator has been in to talk to the patient for diabetic education. Patient will be discharged with metformin. He has a primary care appointment tomorrow where they will discuss this further. ____________________________________________   FINAL CLINICAL IMPRESSION(S) / ED DIAGNOSES  New onset diabetes    Minna AntisKevin Deberah Adolf, MD 08/09/16 1538

## 2016-08-09 NOTE — Discharge Instructions (Signed)
Please follow-up with your primary care doctor tomorrow regarding your likely diagnosis of new onset type 2 diabetes. Further workup is required including a hemoglobin A1c level. Please fill and begin taking your prescribed metformin. Return to the emergency department for any further personally concerning symptoms.

## 2016-08-09 NOTE — Progress Notes (Signed)
Inpatient Diabetes Program Recommendations  AACE/ADA: New Consensus Statement on Inpatient Glycemic Control (2015)  Target Ranges:  Prepandial:   less than 140 mg/dL      Peak postprandial:   less than 180 mg/dL (1-2 hours)      Critically ill patients:  140 - 180 mg/dL   Spoke with patient about new diabetes diagnosis. Random glucose >500 mg/dl. Explained how DM is diagnosed. Discussed basic pathophysiology of DM Type 2, basic home care, importance of checking CBGs and maintaining good CBG control to prevent long-term and short-term complications. Reviewed glucose and A1C goals and explained that patient will need to continue to  Reviewed signs and symptoms of hyperglycemia and hypoglycemia along with treatment for both. Discussed impact of nutrition, exercise, stress, sickness, and medications on diabetes control. Discussed diet at length. Reviewed Living Well with diabetes booklet and encouraged patient to read through entire book. Asked patient to check his glucose twice a day when able to get meter, while on oral medication. Explained how the doctor can use the glucose readings to make medication adjustments if needed. Discussed side effects of oral medications and to take with food. Patient to follow up at PCP office for A1c level tomorrow and DM supply needs since insurance does not start until January 1st, 2018.  Patient verbalized understanding of information discussed and he states that he has no further questions at this time related to diabetes.  Thanks, Christena DeemShannon Elton Catalano RN, MSN, [facility] Inpatient Diabetes Coordinator Team Pager [phone] (8a-5p)

## 2016-08-09 NOTE — Progress Notes (Signed)
Inpatient Diabetes Program Recommendations  AACE/ADA: New Consensus Statement on Inpatient Glycemic Control (2015)  Target Ranges:  Prepandial:   less than 140 mg/dL      Peak postprandial:   less than 180 mg/dL (1-2 hours)      Critically ill patients:  140 - 180 mg/dL   Review of Glycemic Control  Diabetes history: New diagnosis this admission Current orders for Inpatient glycemic control: In ED  Inpatient Diabetes Program Recommendations:   Glucose 552 on admission. Patient to be newly diagnosed this admission. Will follow patient for educational and discharge needs. Note patient does not have primary insurance on file.  Will order Living Well with Diabetes Educational booklet ordered. Will speak with patient.  Thanks,  Christena DeemShannon Ova Gillentine RN, MSN, [facility] Inpatient Diabetes Coordinator Team Pager [phone] (8a-5p)

## 2016-08-09 NOTE — ED Triage Notes (Signed)
Pt c/o increased thirst and urination over the past 2 weeks with increased fatigue and a generalized not feeling well. Denies having any pain..Marland Kitchen

## 2016-08-09 NOTE — ED Notes (Signed)
Dr Lenard LancePaduchowski notified of elevated glucose

## 2016-08-09 NOTE — ED Notes (Signed)
Pt was given a urinal and pt stood beside the bed and urinated w/o incident.

## 2016-08-09 NOTE — ED Notes (Signed)
Dr Lenard LancePaduchowski notified of CBG 444

## 2016-08-10 ENCOUNTER — Encounter: Payer: Self-pay | Admitting: Emergency Medicine

## 2016-08-10 ENCOUNTER — Inpatient Hospital Stay
Admission: EM | Admit: 2016-08-10 | Discharge: 2016-08-11 | DRG: 684 | Disposition: A | Discharge status: Home / Self Care | Payer: Self-pay | Attending: Internal Medicine | Admitting: Internal Medicine

## 2016-08-10 DIAGNOSIS — N179 Acute kidney failure, unspecified: Principal | ICD-10-CM | POA: Diagnosis present

## 2016-08-10 DIAGNOSIS — Z79899 Other long term (current) drug therapy: Secondary | ICD-10-CM

## 2016-08-10 DIAGNOSIS — N183 Chronic kidney disease, stage 3 (moderate): Secondary | ICD-10-CM | POA: Diagnosis present

## 2016-08-10 DIAGNOSIS — E860 Dehydration: Secondary | ICD-10-CM | POA: Diagnosis present

## 2016-08-10 DIAGNOSIS — Z8249 Family history of ischemic heart disease and other diseases of the circulatory system: Secondary | ICD-10-CM

## 2016-08-10 DIAGNOSIS — I129 Hypertensive chronic kidney disease with stage 1 through stage 4 chronic kidney disease, or unspecified chronic kidney disease: Secondary | ICD-10-CM | POA: Diagnosis present

## 2016-08-10 DIAGNOSIS — E1165 Type 2 diabetes mellitus with hyperglycemia: Secondary | ICD-10-CM | POA: Diagnosis present

## 2016-08-10 DIAGNOSIS — E119 Type 2 diabetes mellitus without complications: Secondary | ICD-10-CM

## 2016-08-10 DIAGNOSIS — E1122 Type 2 diabetes mellitus with diabetic chronic kidney disease: Secondary | ICD-10-CM | POA: Diagnosis present

## 2016-08-10 DIAGNOSIS — R1111 Vomiting without nausea: Secondary | ICD-10-CM

## 2016-08-10 HISTORY — DX: Type 2 diabetes mellitus without complications: E11.9

## 2016-08-10 LAB — LIPASE, BLOOD: Lipase: 18 U/L (ref 11–51)

## 2016-08-10 LAB — COMPREHENSIVE METABOLIC PANEL
ALK PHOS: 76 U/L (ref 38–126)
ALT: 31 U/L (ref 17–63)
AST: 31 U/L (ref 15–41)
Albumin: 4.6 g/dL (ref 3.5–5.0)
Anion gap: 13 (ref 5–15)
BUN: 29 mg/dL — AB (ref 6–20)
CALCIUM: 9.4 mg/dL (ref 8.9–10.3)
CO2: 23 mmol/L (ref 22–32)
CREATININE: 1.96 mg/dL — AB (ref 0.61–1.24)
Chloride: 94 mmol/L — ABNORMAL LOW (ref 101–111)
GFR, EST AFRICAN AMERICAN: 46 mL/min — AB (ref >60)
GFR, EST NON AFRICAN AMERICAN: 40 mL/min — AB (ref >60)
Glucose, Bld: 429 mg/dL — ABNORMAL HIGH (ref 65–99)
Potassium: 4.2 mmol/L (ref 3.5–5.1)
SODIUM: 130 mmol/L — AB (ref 135–145)
Total Bilirubin: 1.4 mg/dL — ABNORMAL HIGH (ref 0.3–1.2)
Total Protein: 7.9 g/dL (ref 6.5–8.1)

## 2016-08-10 LAB — BLOOD GAS, VENOUS
Acid-base deficit: 0.3 mmol/L (ref 0.0–2.0)
BICARBONATE: 27.1 mmol/L (ref 20.0–28.0)
PH VEN: 7.3 (ref 7.250–7.430)
Patient temperature: 37
pCO2, Ven: 55 mmHg (ref 44.0–60.0)
pO2, Ven: <31 mmHg — CL (ref 32.0–45.0)

## 2016-08-10 LAB — CBC
HCT: 41.7 % (ref 40.0–52.0)
Hemoglobin: 14.3 g/dL (ref 13.0–18.0)
MCH: 26.6 pg (ref 26.0–34.0)
MCHC: 34.2 g/dL (ref 32.0–36.0)
MCV: 77.9 fL — ABNORMAL LOW (ref 80.0–100.0)
PLATELETS: 254 10*3/uL (ref 150–440)
RBC: 5.36 MIL/uL (ref 4.40–5.90)
RDW: 16.4 % — AB (ref 11.5–14.5)
WBC: 12.1 10*3/uL — ABNORMAL HIGH (ref 3.8–10.6)

## 2016-08-10 LAB — GLUCOSE, CAPILLARY
GLUCOSE-CAPILLARY: 313 mg/dL — AB (ref 65–99)
Glucose-Capillary: 442 mg/dL — ABNORMAL HIGH (ref 65–99)
Glucose-Capillary: 271 mg/dL — ABNORMAL HIGH (ref 65–99)

## 2016-08-10 MED ORDER — INSULIN ASPART 100 UNIT/ML ~~LOC~~ SOLN
0.0000 [IU] | Freq: Three times a day (TID) | SUBCUTANEOUS | Status: DC
  Administered 2016-08-11: 3 [IU] via SUBCUTANEOUS
  Filled 2016-08-10: qty 3

## 2016-08-10 MED ORDER — SODIUM CHLORIDE 0.9 % IV SOLN
INTRAVENOUS | Status: DC
  Administered 2016-08-10 – 2016-08-11 (×2): via INTRAVENOUS

## 2016-08-10 MED ORDER — ENOXAPARIN SODIUM 40 MG/0.4ML ~~LOC~~ SOLN
40.0000 mg | Freq: Two times a day (BID) | SUBCUTANEOUS | Status: DC
  Administered 2016-08-10 – 2016-08-11 (×2): 40 mg via SUBCUTANEOUS
  Filled 2016-08-10 (×2): qty 0.4

## 2016-08-10 MED ORDER — ENOXAPARIN SODIUM 40 MG/0.4ML ~~LOC~~ SOLN: 30.0000 mg | SUBCUTANEOUS | Status: DC

## 2016-08-10 MED ORDER — ACETAMINOPHEN 325 MG PO TABS: 650.0000 mg | ORAL_TABLET | Freq: Four times a day (QID) | ORAL | Status: DC | PRN

## 2016-08-10 MED ORDER — GLIPIZIDE 10 MG PO TABS
10.0000 mg | ORAL_TABLET | Freq: Two times a day (BID) | ORAL | Status: DC
  Administered 2016-08-10 – 2016-08-11 (×2): 10 mg via ORAL
  Filled 2016-08-10 (×3): qty 1

## 2016-08-10 MED ORDER — ONDANSETRON HCL 4 MG/2ML IJ SOLN: 4.0000 mg | Freq: Four times a day (QID) | INTRAMUSCULAR | Status: DC | PRN

## 2016-08-10 MED ORDER — INSULIN ASPART 100 UNIT/ML ~~LOC~~ SOLN
0.0000 [IU] | Freq: Every day | SUBCUTANEOUS | Status: DC
  Administered 2016-08-10: 3 [IU] via SUBCUTANEOUS
  Filled 2016-08-10: qty 3

## 2016-08-10 MED ORDER — INSULIN ASPART 100 UNIT/ML ~~LOC~~ SOLN
10.0000 [IU] | Freq: Once | SUBCUTANEOUS | Status: AC
  Administered 2016-08-10: 10 [IU] via INTRAVENOUS
  Filled 2016-08-10: qty 10

## 2016-08-10 MED ORDER — PNEUMOCOCCAL VAC POLYVALENT 25 MCG/0.5ML IJ INJ: 0.5000 mL | INJECTION | INTRAMUSCULAR | Status: DC

## 2016-08-10 MED ORDER — SODIUM CHLORIDE 0.9 % IV BOLUS (SEPSIS)
1000.0000 mL | Freq: Once | INTRAVENOUS | Status: AC
  Administered 2016-08-10: 1000 mL via INTRAVENOUS

## 2016-08-10 MED ORDER — POLYETHYLENE GLYCOL 3350 17 G PO PACK: 17.0000 g | PACK | Freq: Every day | ORAL | Status: DC | PRN

## 2016-08-10 MED ORDER — LINAGLIPTIN 5 MG PO TABS
5.0000 mg | ORAL_TABLET | Freq: Every day | ORAL | Status: DC
  Administered 2016-08-10 – 2016-08-11 (×2): 5 mg via ORAL
  Filled 2016-08-10 (×2): qty 1

## 2016-08-10 MED ORDER — ONDANSETRON HCL 4 MG/2ML IJ SOLN
4.0000 mg | Freq: Once | INTRAMUSCULAR | Status: AC
  Administered 2016-08-10: 4 mg via INTRAVENOUS
  Filled 2016-08-10: qty 2

## 2016-08-10 MED ORDER — HYDRALAZINE HCL 20 MG/ML IJ SOLN: 10.0000 mg | Freq: Four times a day (QID) | INTRAMUSCULAR | Status: DC | PRN

## 2016-08-10 MED ORDER — ONDANSETRON HCL 4 MG PO TABS: 4.0000 mg | ORAL_TABLET | Freq: Four times a day (QID) | ORAL | Status: DC | PRN

## 2016-08-10 MED ORDER — ACETAMINOPHEN 650 MG RE SUPP: 650.0000 mg | Freq: Four times a day (QID) | RECTAL | Status: DC | PRN

## 2016-08-10 MED ORDER — INFLUENZA VAC SPLIT QUAD 0.5 ML IM SUSY: 0.5000 mL | PREFILLED_SYRINGE | INTRAMUSCULAR | Status: DC

## 2016-08-10 NOTE — ED Triage Notes (Signed)
Patient states that he was diagnosed by his PCP yesterday with Diabetes, patient was started on Metformin. Patient states that he took 1 dose last night and it made him nauseated and made him vomit.  Patient states that he had an appt to go back to his PCP today but he missed the appt and it was rescheduled for 08/20/16. Patient NAD, breathing equal and unlabored at this time.

## 2016-08-10 NOTE — Progress Notes (Signed)
Lovenox changed to 40 mg BID for BMI >40 and CrCl >30. 

## 2016-08-10 NOTE — ED Provider Notes (Signed)
[facility] Emergency Department Provider Note  Time seen: 3:09 PM  I have reviewed the triage vital signs and the nursing notes.   HISTORY  Chief Complaint Nausea and Emesis    HPI Larry Nixon is a 45 y.o. male with a past medical history of hypertension, diagnosed with diabetes yesterday in the emergency department, presents to the emergency department for generalized weakness nausea and vomiting. Patient was seen in the emergency department yesterday for 2-3 weeks of urinary frequency with polydipsia, generalized fatigue. I saw the patient in the emergency department and he was diagnosed with new onset type 2 diabetes. Patient was discharged with metformin and PCP follow-up which he believed he could see today. Patient states he took his metformin yesterday but has been up all night and today vomiting and feeling very nauseous and weak. Patient states he has not been able to keep down any fluids today due to vomiting. Denies any abdominal pain. Patient states he called his primary care doctor today but they said they could not seem until at least February.  Past Medical History:  Diagnosis Date  . Diabetes mellitus without complication (HCC)   . Hypertension     There are no active problems to display for this patient.   History reviewed. No pertinent surgical history.  Prior to Admission medications   Medication Sig Start Date End Date Taking? Authorizing Provider  hydrochlorothiazide (HYDRODIURIL) 25 MG tablet Take 25 mg by mouth daily.   Yes Historical Provider, MD  lisinopril (PRINIVIL,ZESTRIL) 40 MG tablet Take 40 mg by mouth at bedtime.   Yes Historical Provider, MD  metFORMIN (GLUCOPHAGE) 1000 MG tablet Take 1 tablet (1,000 mg total) by mouth 2 (two) times daily with a meal. 08/09/16 08/09/17 Yes Minna AntisKevin Rose Hippler, MD    No Known Allergies  No family history on file.  Social History Social History  Substance Use Topics  . Smoking status: Never  Smoker  . Smokeless tobacco: Never Used  . Alcohol use No    Review of Systems Constitutional: Negative for fever Cardiovascular: Negative for chest pain. Respiratory: Negative for shortness of breath. Gastrointestinal: Negative for abdominal pain. Positive for nausea and vomiting. Positive for some diarrhea today as well. Genitourinary: Negative for dysuria. Neurological: Negative for headache 10-point ROS otherwise negative.  ____________________________________________   PHYSICAL EXAM:  VITAL SIGNS: ED Triage Vitals  Enc Vitals Group     BP 08/10/16 1210 116/68     Pulse Rate 08/10/16 1210 86     Resp 08/10/16 1210 16     Temp 08/10/16 1210 98.4 F (36.9 C)     Temp Source 08/10/16 1210 Oral     SpO2 08/10/16 1210 96 %     Weight 08/10/16 1204 250 lb (113.4 kg)     Height 08/10/16 1204 5\' 6"  (1.676 m)     Head Circumference --      Peak Flow --      Pain Score --      Pain Loc --      Pain Edu? --      Excl. in GC? --     Constitutional: Alert and oriented. Well appearing and in no distress. Eyes: Normal exam ENT   Head: Normocephalic and atraumatic.   Mouth/Throat: Dry mucous membranes. Cardiovascular: Normal rate, regular rhythm. No murmur Respiratory: Normal respiratory effort without tachypnea nor retractions. Breath sounds are clear  Gastrointestinal: Soft and nontender. No distention.   Musculoskeletal: Nontender with normal range of motion in all  extremities.  Neurologic:  Normal speech and language. No gross focal neurologic deficits  Skin:  Skin is warm, dry and intact.  Psychiatric: Mood and affect are normal.   ____________________________________________   INITIAL IMPRESSION / ASSESSMENT AND PLAN / ED COURSE  Pertinent labs & imaging results that were available during my care of the patient were reviewed by me and considered in my medical decision making (see chart for details).  The patient presents the emergency department for nausea  and vomiting since last night. Patient does appear dry on examination. Patient's creatinine has continued to elevate currently 1.96, yesterday was elevated 1.5 from his baseline of 1.0. Patient states he has not been able tolerate fluids today. Patient's blood glucose is once again elevated to 430. We will give IV fluids, insulin, Zofran, check a VBG and closely monitor. Patient's VBG shows a. Of 7.3. Patient continues to be nauseated in the emergency department. As the patient has failed outpatient therapy, cannot tolerate metformin and does not have a primary care appointment for approximately 2 months and believe the patient would benefit from an admission to the hospital for further treatment and medication optimization. Patient agreeable to this plan.  ____________________________________________   FINAL CLINICAL IMPRESSION(S) / ED DIAGNOSES  Hyperglycemia New-onset diabetes   Minna AntisKevin Kianna Billet, MD 08/10/16 1743

## 2016-08-10 NOTE — H&P (Signed)
[facility] [place] [facility]   PATIENT NAME: Larry PersonsBryan Meno    MR#:  [id number]  DATE OF BIRTH:  [birth year]  DATE OF ADMISSION:  08/10/2016  PRIMARY CARE PHYSICIAN: Geoffery Lyonsurner, Eric M., PA   REQUESTING/REFERRING PHYSICIAN: Dr. Lenard LancePaduchowski  CHIEF COMPLAINT:   Chief Complaint  Patient presents with  . Nausea  . Emesis    HISTORY OF PRESENT ILLNESS:  Larry Nixon  is a 45 y.o. male with a known history of Hypertension, hyperglycemia not on diabetes medications presents to the emergency room due to intractable vomiting and mild abdominal cramping. Patient has noticed 3 weeks off polydipsia and polyuria and was seen in the emergency room yesterday. He was hydrated and started on metformin and discharged home to follow-up with his primary care physician. Today he returns with acute kidney injury, vomiting, hyperglycemia and is being admitted to the hospital. He does not have any tingling or numbness or vision problems.  PAST MEDICAL HISTORY:   Past Medical History:  Diagnosis Date  . Diabetes mellitus without complication (HCC)   . Hypertension     PAST SURGICAL HISTORY:  History reviewed. No pertinent surgical history.  SOCIAL HISTORY:   Social History  Substance Use Topics  . Smoking status: Never Smoker  . Smokeless tobacco: Never Used  . Alcohol use No    FAMILY HISTORY:   Family History  Problem Relation Age of Onset  . Hypertension Father     DRUG ALLERGIES:  No Known Allergies  REVIEW OF SYSTEMS:   Review of Systems  Constitutional: Positive for malaise/fatigue. Negative for chills, fever and weight loss.  HENT: Negative for hearing loss and nosebleeds.   Eyes: Negative for blurred vision, double vision and pain.  Respiratory: Negative for cough, hemoptysis, sputum production, shortness of breath and wheezing.   Cardiovascular: Negative for chest pain, palpitations, orthopnea and leg swelling.  Gastrointestinal: Positive for nausea and  vomiting. Negative for abdominal pain, constipation and diarrhea.  Genitourinary: Negative for dysuria and hematuria.  Musculoskeletal: Negative for back pain, falls and myalgias.  Skin: Negative for rash.  Neurological: Positive for dizziness and weakness. Negative for tremors, sensory change, speech change, focal weakness, seizures and headaches.  Endo/Heme/Allergies: Does not bruise/bleed easily.  Psychiatric/Behavioral: Negative for depression and memory loss. The patient is not nervous/anxious.     MEDICATIONS AT HOME:   Prior to Admission medications   Medication Sig Start Date End Date Taking? Authorizing Provider  hydrochlorothiazide (HYDRODIURIL) 25 MG tablet Take 25 mg by mouth daily.   Yes Historical Provider, MD  lisinopril (PRINIVIL,ZESTRIL) 40 MG tablet Take 40 mg by mouth at bedtime.   Yes Historical Provider, MD  metFORMIN (GLUCOPHAGE) 1000 MG tablet Take 1 tablet (1,000 mg total) by mouth 2 (two) times daily with a meal. 08/09/16 08/09/17 Yes Minna AntisKevin Paduchowski, MD     VITAL SIGNS:  Blood pressure 116/68, pulse 86, temperature 98.4 F (36.9 C), temperature source Oral, resp. rate 16, height 5\' 6"  (1.676 m), weight 113.4 kg (250 lb), SpO2 96 %.  PHYSICAL EXAMINATION:  Physical Exam  GENERAL:  45 y.o.-year-old patient lying in the bed with no acute distress.Obese  EYES: Pupils equal, round, reactive to light and accommodation. No scleral icterus. Extraocular muscles intact.  HEENT: Head atraumatic, normocephalic. Oropharynx and nasopharynx clear. No oropharyngeal erythema, moist oral dry NECK:  Supple, no jugular venous distention. No thyroid enlargement, no tenderness.  LUNGS: Normal breath sounds bilaterally, no wheezing, rales, rhonchi. No use of accessory muscles of  respiration.  CARDIOVASCULAR: S1, S2 normal. No murmurs, rubs, or gallops.  ABDOMEN: Soft, nontender, nondistended. Bowel sounds present. No organomegaly or mass.  EXTREMITIES: No pedal edema, cyanosis,  or clubbing. + 2 pedal & radial pulses b/l.   NEUROLOGIC: Cranial nerves II through XII are intact. No focal Motor or sensory deficits appreciated b/l PSYCHIATRIC: The patient is alert and oriented x 3. Good affect.  SKIN: No obvious rash, lesion, or ulcer.   LABORATORY PANEL:   CBC  Recent Labs Lab 08/10/16 1216  WBC 12.1*  HGB 14.3  HCT 41.7  PLT 254   ------------------------------------------------------------------------------------------------------------------  Chemistries   Recent Labs Lab 08/10/16 1216  NA 130*  K 4.2  CL 94*  CO2 23  GLUCOSE 429*  BUN 29*  CREATININE 1.96*  CALCIUM 9.4  AST 31  ALT 31  ALKPHOS 76  BILITOT 1.4*   ------------------------------------------------------------------------------------------------------------------  Cardiac Enzymes No results for input(s): TROPONINI in the last 168 hours. ------------------------------------------------------------------------------------------------------------------  RADIOLOGY:  No results found.   IMPRESSION AND PLAN:   * Acute kidney injury due to dehydration from hyperglycemia. He likely has underlying CKD stage III with history of hypertension and diabetes. Bolus normal saline and continue maintenance fluids. Hold lisinopril and hydrochlorothiazide. Monitor input and output. Repeat labs in the morning.  * Hyperglycemia with uncontrolled diabetes mellitus Patient usually had hypoglycemia in the past but was never on medications. Started on metformin yesterday couldn't tolerate. We'll check HbA1c. Sliding scale insulin. Start glipizide twice a day. He most likely needs insulin but he wants to try oral medications first.  * Hypertension Blood pressure in the normal range at this time. Lisinopril and hydrochlorothiazide being held due to acute kidney injury. Add IV when necessary hydralazine.  * Pseudohyponatremia due to hyperglycemia should improve as blood glucose improves. Also  contributed by dehydration.  * DVT prophylaxis with Lovenox  All the records are reviewed and case discussed with ED provider. Management plans discussed with the patient, family and they are in agreement.  CODE STATUS: FULL CODE  TOTAL TIME TAKING CARE OF THIS PATIENT: 40 minutes.   Milagros LollSudini, Laquetta Racey R M.D on 08/10/2016 at 6:24 PM  Between 7am to 6pm - Pager - [phone]  After 6pm go to [email or web address] - [email or web address] [facility]  [facility] [place] [facility]  Office  [phone]  CC: Primary care physician; Geoffery Lyonsurner, Eric M., PA  Note: This dictation was prepared with Dragon dictation along with smaller phrase technology. Any transcriptional errors that result from this process are unintentional.

## 2016-08-11 LAB — BASIC METABOLIC PANEL
Anion gap: 8 (ref 5–15)
BUN: 17 mg/dL (ref 6–20)
CHLORIDE: 104 mmol/L (ref 101–111)
CO2: 25 mmol/L (ref 22–32)
Calcium: 8.7 mg/dL — ABNORMAL LOW (ref 8.9–10.3)
Creatinine, Ser: 1.12 mg/dL (ref 0.61–1.24)
GFR calc Af Amer: >60 mL/min (ref >60)
GFR calc non Af Amer: >60 mL/min (ref >60)
GLUCOSE: 219 mg/dL — AB (ref 65–99)
POTASSIUM: 3.4 mmol/L — AB (ref 3.5–5.1)
Sodium: 137 mmol/L (ref 135–145)

## 2016-08-11 LAB — HEMOGLOBIN A1C
Hgb A1c MFr Bld: 11.3 % — ABNORMAL HIGH (ref 4.8–5.6)
Mean Plasma Glucose: 278 mg/dL

## 2016-08-11 LAB — GLUCOSE, CAPILLARY: GLUCOSE-CAPILLARY: 192 mg/dL — AB (ref 65–99)

## 2016-08-11 MED ORDER — METFORMIN HCL 500 MG PO TABS
1000.0000 mg | ORAL_TABLET | Freq: Two times a day (BID) | ORAL | Status: DC
  Administered 2016-08-11: 1000 mg via ORAL
  Filled 2016-08-11: qty 2

## 2016-08-11 MED ORDER — GLIPIZIDE 5 MG PO TABS: 10.0000 mg | ORAL_TABLET | Freq: Two times a day (BID) | ORAL | 2 refills | Status: DC

## 2016-08-11 MED ORDER — GLIPIZIDE 10 MG PO TABS: 10.0000 mg | ORAL_TABLET | Freq: Two times a day (BID) | ORAL | 2 refills | Status: DC

## 2016-08-11 NOTE — Progress Notes (Signed)
Patient denies pain; voices understanding of DM; discharge instructions as ordered;; discharge instructions given as ordered; Discharge by staff via w/c

## 2016-08-11 NOTE — Discharge Instructions (Signed)
Carb controlled diet

## 2016-08-11 NOTE — Discharge Summary (Signed)
[facility] [place] [facility]   PATIENT NAME: Larry PersonsBryan Rubel    MR#:  [id number]  DATE OF BIRTH:  05-Nov-1970  DATE OF ADMISSION:  08/10/2016 ADMITTING PHYSICIAN: Milagros LollSrikar Sudini, MD   DATE OF DISCHARGE: 08/11/16  PRIMARY CARE PHYSICIAN: No PCP Per Patient    ADMISSION DIAGNOSIS:  New onset type 2 diabetes mellitus (HCC) [E11.9] Vomiting without nausea, intractability of vomiting not specified, unspecified vomiting type [R11.11]  DISCHARGE DIAGNOSIS:  New onset Type II DM HTN Acute renal failure -resolved  SECONDARY DIAGNOSIS:   Past Medical History:  Diagnosis Date  . Diabetes mellitus without complication (HCC)   . Hypertension     HOSPITAL COURSE:  Larry Nixon  is a 45 y.o. male with a known history of Hypertension, hyperglycemia not on diabetes medications presents to the emergency room due to intractable vomiting and mild abdominal cramping. Patient has noticed 3 weeks off polydipsia and polyuria and was seen in the emergency room yesterday.  * Acute kidney injury due to dehydration from hyperglycemia. -renal function back to normal. This was due to dehydration Creat stable at 1.1  * Hyperglycemia with uncontrolled diabetes mellitus-II new onset -metformin 1000 mg bid and glipizide 5 mg bid - HbA1c 11 -was on  Sliding scale insulin. -He most likely needs insulin but he wants to try oral medications first.  * Hypertension Resume Lisinopril and hydrochlorothiazide which were being held due to acute kidney injury. Add IV when necessary hydralazine.  * Pseudohyponatremia due to hyperglycemia should improve as blood glucose improves. Also contributed by dehydration. -improved NA  * DVT prophylaxis with Lovenox  D/w pt diet,exercise, glucose checks at home and [facility]  D/c home  CONSULTS OBTAINED:  Treatment Team:  Enedina FinnerSona Samyra Limb, MD  DRUG ALLERGIES:  No Known Allergies  DISCHARGE MEDICATIONS:   Current  Discharge Medication List    START taking these medications   Details  glipiZIDE (GLUCOTROL) 5 MG tablet Take 2 tablets (10 mg total) by mouth 2 (two) times daily before a meal. Qty: 60 tablet, Refills: 2      CONTINUE these medications which have NOT CHANGED   Details  hydrochlorothiazide (HYDRODIURIL) 25 MG tablet Take 25 mg by mouth daily.    lisinopril (PRINIVIL,ZESTRIL) 40 MG tablet Take 40 mg by mouth at bedtime.    metFORMIN (GLUCOPHAGE) 1000 MG tablet Take 1 tablet (1,000 mg total) by mouth 2 (two) times daily with a meal. Qty: 60 tablet, Refills: 1        If you experience worsening of your admission symptoms, develop shortness of breath, life threatening emergency, suicidal or homicidal thoughts you must seek medical attention immediately by calling 911 or calling your MD immediately  if symptoms less severe.  You Must read complete instructions/literature along with all the possible adverse reactions/side effects for all the Medicines you take and that have been prescribed to you. Take any new Medicines after you have completely understood and accept all the possible adverse reactions/side effects.   Please note  You were cared for by a hospitalist during your hospital stay. If you have any questions about your discharge medications or the care you received while you were in the hospital after you are discharged, you can call the unit and asked to speak with the hospitalist on call if the hospitalist that took care of you is not available. Once you are discharged, your primary care physician will handle any further medical issues. Please note  that NO REFILLS for any discharge medications will be authorized once you are discharged, as it is imperative that you return to your primary care physician (or establish a relationship with a primary care physician if you do not have one) for your aftercare needs so that they can reassess your need for medications and monitor your lab  values. Today   SUBJECTIVE   No vomiting. Ate well VITAL SIGNS:  Blood pressure (!) 116/49, pulse 68, temperature 97.6 F (36.4 C), temperature source Oral, resp. rate 19, height 5\' 6"  (1.676 m), weight 113.4 kg (250 lb), SpO2 98 %.  I/O:   Intake/Output Summary (Last 24 hours) at 08/11/16 0918 Last data filed at 08/11/16 [id number]  Gross per 24 hour  Intake            827.5 ml  Output              600 ml  Net            227.5 ml    PHYSICAL EXAMINATION:  GENERAL:  45 y.o.-year-old patient lying in the bed with no acute distress.  EYES: Pupils equal, round, reactive to light and accommodation. No scleral icterus. Extraocular muscles intact.  HEENT: Head atraumatic, normocephalic. Oropharynx and nasopharynx clear.  NECK:  Supple, no jugular venous distention. No thyroid enlargement, no tenderness.  LUNGS: Normal breath sounds bilaterally, no wheezing, rales,rhonchi or crepitation. No use of accessory muscles of respiration.  CARDIOVASCULAR: S1, S2 normal. No murmurs, rubs, or gallops.  ABDOMEN: Soft, non-tender, non-distended. Bowel sounds present. No organomegaly or mass.  EXTREMITIES: No pedal edema, cyanosis, or clubbing.  NEUROLOGIC: Cranial nerves II through XII are intact. Muscle strength 5/5 in all extremities. Sensation intact. Gait not checked.  PSYCHIATRIC:  patient is alert and oriented x 3.  SKIN: No obvious rash, lesion, or ulcer.   DATA REVIEW:   CBC   Recent Labs Lab 08/10/16 1216  WBC 12.1*  HGB 14.3  HCT 41.7  PLT 254    Chemistries   Recent Labs Lab 08/10/16 1216 08/11/16 0504  NA 130* 137  K 4.2 3.4*  CL 94* 104  CO2 23 25  GLUCOSE 429* 219*  BUN 29* 17  CREATININE 1.96* 1.12  CALCIUM 9.4 8.7*  AST 31  --   ALT 31  --   ALKPHOS 76  --   BILITOT 1.4*  --     Microbiology Results   No results found for this or any previous visit (from the past 240 hour(s)).  RADIOLOGY:  No results found.   Management plans discussed with the  patient, family and they are in agreement.  CODE STATUS:     Code Status Orders        Start     Ordered   08/10/16 1823  Full code  Continuous     08/10/16 1823    Code Status History    Date Active Date Inactive Code Status Order ID Comments User Context   This patient has a current code status but no historical code status.      TOTAL TIME TAKING CARE OF THIS PATIENT: 40minutes.    Rahm Minix M.D on 08/11/2016 at 9:18 AM  Between 7am to 6pm - Pager - [phone] After 6pm go to [email or web address] - password [email or web address] [facility]  [facility]  Office  [phone]  CC: Primary care physician; No PCP Per Patient

## 2016-08-15 ENCOUNTER — Emergency Department
Admission: EM | Admit: 2016-08-15 | Discharge: 2016-08-15 | Disposition: A | Discharge status: Home / Self Care | Payer: Self-pay | Attending: Emergency Medicine | Admitting: Emergency Medicine

## 2016-08-15 ENCOUNTER — Encounter: Payer: Self-pay | Admitting: Emergency Medicine

## 2016-08-15 DIAGNOSIS — R109 Unspecified abdominal pain: Secondary | ICD-10-CM

## 2016-08-15 DIAGNOSIS — R42 Dizziness and giddiness: Secondary | ICD-10-CM | POA: Insufficient documentation

## 2016-08-15 DIAGNOSIS — G8929 Other chronic pain: Secondary | ICD-10-CM | POA: Insufficient documentation

## 2016-08-15 DIAGNOSIS — Z79899 Other long term (current) drug therapy: Secondary | ICD-10-CM | POA: Insufficient documentation

## 2016-08-15 DIAGNOSIS — R1084 Generalized abdominal pain: Secondary | ICD-10-CM | POA: Insufficient documentation

## 2016-08-15 DIAGNOSIS — R51 Headache: Secondary | ICD-10-CM | POA: Insufficient documentation

## 2016-08-15 DIAGNOSIS — E119 Type 2 diabetes mellitus without complications: Secondary | ICD-10-CM | POA: Insufficient documentation

## 2016-08-15 DIAGNOSIS — I10 Essential (primary) hypertension: Secondary | ICD-10-CM | POA: Insufficient documentation

## 2016-08-15 DIAGNOSIS — Z7984 Long term (current) use of oral hypoglycemic drugs: Secondary | ICD-10-CM | POA: Insufficient documentation

## 2016-08-15 LAB — CBC
HCT: 40.9 % (ref 40.0–52.0)
Hemoglobin: 13.7 g/dL (ref 13.0–18.0)
MCH: 26.4 pg (ref 26.0–34.0)
MCHC: 33.4 g/dL (ref 32.0–36.0)
MCV: 79.1 fL — ABNORMAL LOW (ref 80.0–100.0)
PLATELETS: 279 10*3/uL (ref 150–440)
RBC: 5.18 MIL/uL (ref 4.40–5.90)
RDW: 16.1 % — AB (ref 11.5–14.5)
WBC: 10.9 10*3/uL — AB (ref 3.8–10.6)

## 2016-08-15 LAB — BASIC METABOLIC PANEL
Anion gap: 10 (ref 5–15)
BUN: 11 mg/dL (ref 6–20)
CALCIUM: 9.5 mg/dL (ref 8.9–10.3)
CO2: 27 mmol/L (ref 22–32)
Chloride: 99 mmol/L — ABNORMAL LOW (ref 101–111)
Creatinine, Ser: 1.19 mg/dL (ref 0.61–1.24)
GFR calc Af Amer: >60 mL/min (ref >60)
GLUCOSE: 126 mg/dL — AB (ref 65–99)
POTASSIUM: 3.1 mmol/L — AB (ref 3.5–5.1)
Sodium: 136 mmol/L (ref 135–145)

## 2016-08-15 LAB — GLUCOSE, CAPILLARY: GLUCOSE-CAPILLARY: 129 mg/dL — AB (ref 65–99)

## 2016-08-15 MED ORDER — FAMOTIDINE 20 MG PO TABS
40.0000 mg | ORAL_TABLET | Freq: Once | ORAL | Status: AC
  Administered 2016-08-15: 40 mg via ORAL
  Filled 2016-08-15: qty 2

## 2016-08-15 MED ORDER — METOCLOPRAMIDE HCL 10 MG PO TABS
10.0000 mg | ORAL_TABLET | Freq: Once | ORAL | Status: AC
  Administered 2016-08-15: 10 mg via ORAL
  Filled 2016-08-15: qty 1

## 2016-08-15 MED ORDER — MECLIZINE HCL 25 MG PO TABS
50.0000 mg | ORAL_TABLET | Freq: Once | ORAL | Status: AC
  Administered 2016-08-15: 50 mg via ORAL
  Filled 2016-08-15: qty 2

## 2016-08-15 MED ORDER — FAMOTIDINE 20 MG PO TABS: 20.0000 mg | ORAL_TABLET | Freq: Two times a day (BID) | ORAL | 0 refills | Status: DC

## 2016-08-15 MED ORDER — METOCLOPRAMIDE HCL 10 MG PO TABS: 10.0000 mg | ORAL_TABLET | Freq: Four times a day (QID) | ORAL | 0 refills | Status: DC | PRN

## 2016-08-15 MED ORDER — MECLIZINE HCL 25 MG PO TABS: 25.0000 mg | ORAL_TABLET | Freq: Three times a day (TID) | ORAL | 1 refills | Status: DC | PRN

## 2016-08-15 MED ORDER — NAPROXEN 500 MG PO TABS
500.0000 mg | ORAL_TABLET | Freq: Once | ORAL | Status: AC
  Administered 2016-08-15: 500 mg via ORAL
  Filled 2016-08-15: qty 1

## 2016-08-15 NOTE — ED Notes (Signed)
CBG 129 

## 2016-08-15 NOTE — ED Provider Notes (Signed)
[facility] Emergency Department Provider Note  ____________________________________________  Time seen: Approximately 8:07 AM  I have reviewed the triage vital signs and the nursing notes.   HISTORY  Chief Complaint Dizziness    HPI Larry Nixon is a 46 y.o. male who complains of lightheadedness and dizziness over the past week, worse over last 24 hours. Also reports chronic left-sided abdominal cramping pain which is unchanged. No fever chills vomiting or diarrhea. No constipation. No trauma. No chest pain shortness of breath sore throat runny nose or cough.  Recently diagnosed with diabetes. Compliant with metformin hydrochlorothiazide lisinopril glipizide and baby aspirin that he was prescribed here at the hospital. In the process of seeking a new primary care doctor.     Past Medical History:  Diagnosis Date  . Diabetes mellitus without complication (HCC)   . Hypertension      Patient Active Problem List   Diagnosis Date Noted  . ARF (acute renal failure) (HCC) 08/10/2016     History reviewed. No pertinent surgical history.   Prior to Admission medications   Medication Sig Start Date End Date Taking? Authorizing Provider  famotidine (PEPCID) 20 MG tablet Take 1 tablet (20 mg total) by mouth 2 (two) times daily. 08/15/16   Sharman Cheek, MD  glipiZIDE (GLUCOTROL) 5 MG tablet Take 2 tablets (10 mg total) by mouth 2 (two) times daily before a meal. 08/11/16   Enedina Finner, MD  hydrochlorothiazide (HYDRODIURIL) 25 MG tablet Take 25 mg by mouth daily.    Historical Provider, MD  lisinopril (PRINIVIL,ZESTRIL) 40 MG tablet Take 40 mg by mouth at bedtime.    Historical Provider, MD  meclizine (ANTIVERT) 25 MG tablet Take 1 tablet (25 mg total) by mouth 3 (three) times daily as needed for dizziness or nausea. 08/15/16   Sharman Cheek, MD  metFORMIN (GLUCOPHAGE) 1000 MG tablet Take 1 tablet (1,000 mg total) by mouth 2 (two) times daily with a meal.  08/09/16 08/09/17  Minna Antis, MD  metoCLOPramide (REGLAN) 10 MG tablet Take 1 tablet (10 mg total) by mouth every 6 (six) hours as needed. 08/15/16   Sharman Cheek, MD     Allergies Patient has no known allergies.   Family History  Problem Relation Age of Onset  . Hypertension Father     Social History Social History  Substance Use Topics  . Smoking status: Never Smoker  . Smokeless tobacco: Never Used  . Alcohol use No    Review of Systems  Constitutional:   No fever or chills.  ENT:   No sore throat. No rhinorrhea. Cardiovascular:   No chest pain. Respiratory:   No dyspnea or cough. Gastrointestinal:   Chronic abdominal pain without vomiting and diarrhea.  Genitourinary:   Negative for dysuria or difficulty urinating. Musculoskeletal:   Negative for focal pain or swelling Neurological:   Positive generalized bilateral headache, positive dizziness 10-point ROS otherwise negative.  ____________________________________________   PHYSICAL EXAM:  VITAL SIGNS: ED Triage Vitals [08/15/16 0716]  Enc Vitals Group     BP 133/82     Pulse Rate 92     Resp 18     Temp 98.2 F (36.8 C)     Temp Source Oral     SpO2 99 %     Weight 250 lb (113.4 kg)     Height 5\' 6"  (1.676 m)     Head Circumference      Peak Flow      Pain Score 3  Pain Loc      Pain Edu?      Excl. in GC?     Vital signs reviewed, nursing assessments reviewed.   Constitutional:   Alert and oriented. Well appearing and in no distress. Eyes:   No scleral icterus. No conjunctival pallor. PERRL. EOMI.  No nystagmus. ENT   Head:   Normocephalic and atraumatic.   Nose:   No congestion/rhinnorhea. No septal hematoma   Mouth/Throat:   MMM, no pharyngeal erythema. No peritonsillar mass.    Neck:   No stridor. No SubQ emphysema. No meningismus. Hematological/Lymphatic/Immunilogical:   No cervical lymphadenopathy. Cardiovascular:   RRR. Symmetric bilateral radial and DP pulses.   No murmurs.  Respiratory:   Normal respiratory effort without tachypnea nor retractions. Breath sounds are clear and equal bilaterally. No wheezes/rales/rhonchi. Gastrointestinal:   Soft and nontender. Non distended. There is no CVA tenderness.  No rebound, rigidity, or guarding. Genitourinary:   deferred Musculoskeletal:   Nontender with normal range of motion in all extremities. No joint effusions.  No lower extremity tenderness.  No edema. Neurologic:   Normal speech and language.  CN 2-10 normal. Motor grossly intact. No gross focal neurologic deficits are appreciated.  Skin:    Skin is warm, dry and intact. No rash noted.  No petechiae, purpura, or bullae.  ____________________________________________    LABS (pertinent positives/negatives) (all labs ordered are listed, but only abnormal results are displayed) Labs Reviewed  BASIC METABOLIC PANEL - Abnormal; Notable for the following:       Result Value   Potassium 3.1 (*)    Chloride 99 (*)    Glucose, Bld 126 (*)    All other components within normal limits  CBC - Abnormal; Notable for the following:    WBC 10.9 (*)    MCV 79.1 (*)    RDW 16.1 (*)    All other components within normal limits  GLUCOSE, CAPILLARY - Abnormal; Notable for the following:    Glucose-Capillary 129 (*)    All other components within normal limits  CBG MONITORING, ED   ____________________________________________   EKG  Interpreted by me  Date: 08/15/2016  Rate: 89  Rhythm: normal sinus rhythm  QRS Axis: normal  Intervals: normal  ST/T Wave abnormalities: normal  Conduction Disutrbances: none  Narrative Interpretation: unremarkable      ____________________________________________    RADIOLOGY    ____________________________________________   PROCEDURES Procedures  ____________________________________________   INITIAL IMPRESSION / ASSESSMENT AND PLAN / ED COURSE  Pertinent labs & imaging results that were  available during my care of the patient were reviewed by me and considered in my medical decision making (see chart for details).  Patient well appearing no acute distress. Presents with unremarkable vital signs, vague symptom of dizziness. Glucose is acceptable. Labs unremarkable and unchanged from last check a few days ago. Overall presentation is not concerning for acute pathology such as urinary tract infection pneumonia meningitis encephalitis stroke intracranial hemorrhage sepsis or intra-abdominal pathology. Low suspicion for perforation obstruction biliary disease pancreatitis appendicitis AAA.  Possible the patient is developing a bit of a viral URI, although I think more likely the patient is having some side effects and interactions from his new medication including metformin. Overall the benefits of these medications in managing his hypertension and diabetes far outweigh these adverse side effects, so recommend that he continues them all at current doses. With time I think he will adjust and the symptoms will subside. In the meantime we'll control symptoms with  antiemetics and antihistamines.     Clinical Course    ____________________________________________   FINAL CLINICAL IMPRESSION(S) / ED DIAGNOSES  Final diagnoses:  Dizziness  Chronic abdominal pain      New Prescriptions   FAMOTIDINE (PEPCID) 20 MG TABLET    Take 1 tablet (20 mg total) by mouth 2 (two) times daily.   MECLIZINE (ANTIVERT) 25 MG TABLET    Take 1 tablet (25 mg total) by mouth 3 (three) times daily as needed for dizziness or nausea.   METOCLOPRAMIDE (REGLAN) 10 MG TABLET    Take 1 tablet (10 mg total) by mouth every 6 (six) hours as needed.     Portions of this note were generated with dragon dictation software. Dictation errors may occur despite best attempts at proofreading.    Sharman Cheek, MD 08/15/16 [phone]

## 2016-08-15 NOTE — ED Triage Notes (Signed)
Pt c/o dizziness and feeling lightheaded over last week but worse today. Also c/o generalized cramps. Denies fevers. NAD in triage. ambulatory without difficulty.

## 2016-10-12 DIAGNOSIS — E119 Type 2 diabetes mellitus without complications: Secondary | ICD-10-CM | POA: Insufficient documentation

## 2016-10-12 DIAGNOSIS — I10 Essential (primary) hypertension: Secondary | ICD-10-CM | POA: Insufficient documentation

## 2016-10-12 DIAGNOSIS — G4733 Obstructive sleep apnea (adult) (pediatric): Secondary | ICD-10-CM | POA: Insufficient documentation

## 2017-10-21 ENCOUNTER — Emergency Department
Admission: EM | Admit: 2017-10-21 | Discharge: 2017-10-21 | Disposition: A | Discharge status: Home / Self Care | Payer: Self-pay | Attending: Emergency Medicine | Admitting: Emergency Medicine

## 2017-10-21 ENCOUNTER — Encounter: Payer: Self-pay | Admitting: Emergency Medicine

## 2017-10-21 ENCOUNTER — Other Ambulatory Visit: Payer: Self-pay

## 2017-10-21 DIAGNOSIS — Z7984 Long term (current) use of oral hypoglycemic drugs: Secondary | ICD-10-CM | POA: Insufficient documentation

## 2017-10-21 DIAGNOSIS — E119 Type 2 diabetes mellitus without complications: Secondary | ICD-10-CM | POA: Insufficient documentation

## 2017-10-21 DIAGNOSIS — Z79899 Other long term (current) drug therapy: Secondary | ICD-10-CM | POA: Insufficient documentation

## 2017-10-21 DIAGNOSIS — I10 Essential (primary) hypertension: Secondary | ICD-10-CM | POA: Insufficient documentation

## 2017-10-21 DIAGNOSIS — R197 Diarrhea, unspecified: Secondary | ICD-10-CM | POA: Insufficient documentation

## 2017-10-21 LAB — URINALYSIS, COMPLETE (UACMP) WITH MICROSCOPIC
BACTERIA UA: NONE SEEN
BILIRUBIN URINE: NEGATIVE
Glucose, UA: NEGATIVE mg/dL
Hgb urine dipstick: NEGATIVE
KETONES UR: NEGATIVE mg/dL
Leukocytes, UA: NEGATIVE
Nitrite: NEGATIVE
PROTEIN: NEGATIVE mg/dL
RBC / HPF: NONE SEEN RBC/hpf (ref 0–5)
Specific Gravity, Urine: 1.02 (ref 1.005–1.030)
pH: 5 (ref 5.0–8.0)

## 2017-10-21 LAB — COMPREHENSIVE METABOLIC PANEL
ALT: 27 U/L (ref 17–63)
ANION GAP: 11 (ref 5–15)
AST: 26 U/L (ref 15–41)
Albumin: 4.7 g/dL (ref 3.5–5.0)
Alkaline Phosphatase: 91 U/L (ref 38–126)
BUN: 15 mg/dL (ref 6–20)
CO2: 21 mmol/L — AB (ref 22–32)
CREATININE: 1.18 mg/dL (ref 0.61–1.24)
Calcium: 9.2 mg/dL (ref 8.9–10.3)
Chloride: 107 mmol/L (ref 101–111)
GFR calc non Af Amer: >60 mL/min (ref >60)
Glucose, Bld: 146 mg/dL — ABNORMAL HIGH (ref 65–99)
Potassium: 3.4 mmol/L — ABNORMAL LOW (ref 3.5–5.1)
Sodium: 139 mmol/L (ref 135–145)
Total Bilirubin: 1.1 mg/dL (ref 0.3–1.2)
Total Protein: 8.5 g/dL — ABNORMAL HIGH (ref 6.5–8.1)

## 2017-10-21 LAB — CBC
HEMATOCRIT: 46.2 % (ref 40.0–52.0)
HEMOGLOBIN: 15 g/dL (ref 13.0–18.0)
MCH: 25.6 pg — AB (ref 26.0–34.0)
MCHC: 32.4 g/dL (ref 32.0–36.0)
MCV: 79 fL — ABNORMAL LOW (ref 80.0–100.0)
Platelets: 315 10*3/uL (ref 150–440)
RBC: 5.85 MIL/uL (ref 4.40–5.90)
RDW: 16.1 % — ABNORMAL HIGH (ref 11.5–14.5)
WBC: 12 10*3/uL — ABNORMAL HIGH (ref 3.8–10.6)

## 2017-10-21 LAB — LIPASE, BLOOD: Lipase: 34 U/L (ref 11–51)

## 2017-10-21 MED ORDER — GLIPIZIDE 5 MG PO TABS: 10.0000 mg | ORAL_TABLET | Freq: Two times a day (BID) | ORAL | 2 refills | Status: DC

## 2017-10-21 NOTE — ED Provider Notes (Signed)
[facility] Emergency Department Provider Note   ____________________________________________    I have reviewed the triage vital signs and the nursing notes.   HISTORY  Chief Complaint Diarrhea     HPI Larry Nixon is a 47 y.o. male who presents with complaints of diarrhea.  Patient with a history of diabetes and hypertension reports 3-4 days of diarrhea, describes it as watery brown stool.  No blood.  No recent travel.  No camping.  No abdominal pain fevers or chills.  No nausea or vomiting.  Took one dose of Imodium with no significant improvement.  Reports that he has run out of his glipizide and needs a refill.   Past Medical History:  Diagnosis Date  . Diabetes mellitus without complication (HCC)   . Hypertension     Patient Active Problem List   Diagnosis Date Noted  . ARF (acute renal failure) (HCC) 08/10/2016    History reviewed. No pertinent surgical history.  Prior to Admission medications   Medication Sig Start Date End Date Taking? Authorizing Provider  famotidine (PEPCID) 20 MG tablet Take 1 tablet (20 mg total) by mouth 2 (two) times daily. 08/15/16   Sharman CheekStafford, Phillip, MD  glipiZIDE (GLUCOTROL) 5 MG tablet Take 2 tablets (10 mg total) by mouth 2 (two) times daily before a meal. 10/21/17   Jene EveryKinner, Kaarin Pardy, MD  hydrochlorothiazide (HYDRODIURIL) 25 MG tablet Take 25 mg by mouth daily.    [provider]  lisinopril (PRINIVIL,ZESTRIL) 40 MG tablet Take 40 mg by mouth at bedtime.    [provider]  meclizine (ANTIVERT) 25 MG tablet Take 1 tablet (25 mg total) by mouth 3 (three) times daily as needed for dizziness or nausea. 08/15/16   Sharman CheekStafford, Phillip, MD  metFORMIN (GLUCOPHAGE) 1000 MG tablet Take 1 tablet (1,000 mg total) by mouth 2 (two) times daily with a meal. 08/09/16 08/09/17  Minna AntisPaduchowski, Kevin, MD  metoCLOPramide (REGLAN) 10 MG tablet Take 1 tablet (10 mg total) by mouth every 6 (six) hours as needed. 08/15/16    Sharman CheekStafford, Phillip, MD     Allergies Patient has no known allergies.  Family History  Problem Relation Age of Onset  . Hypertension Father     Social History Social History   Tobacco Use  . Smoking status: Never Smoker  . Smokeless tobacco: Never Used  Substance Use Topics  . Alcohol use: No  . Drug use: No    Review of Systems  Constitutional: No fever Eyes: No visual changes.  ENT: No sore throat. Cardiovascular: No palpitations Respiratory: No cough Gastrointestinal: No abdominal pain.  No nausea, no vomiting.   Genitourinary: Negative for dysuria. Musculoskeletal: no joint swelling or pain Skin: Negative for rash. Neurological: Negative for headaches    ____________________________________________   PHYSICAL EXAM:  VITAL SIGNS: ED Triage Vitals  Enc Vitals Group     BP 10/21/17 0803 140/83     Pulse Rate 10/21/17 0803 95     Resp 10/21/17 0803 16     Temp 10/21/17 0803 98.4 F (36.9 C)     Temp Source 10/21/17 0803 Oral     SpO2 10/21/17 0803 97 %     Weight 10/21/17 0802 117.9 kg (260 lb)     Height 10/21/17 0802 1.702 m (5\' 7" )     Head Circumference --      Peak Flow --      Pain Score --      Pain Loc --  Pain Edu? --      Excl. in GC? --     Constitutional: Alert and oriented. No acute distress. Pleasant and interactive Eyes: Conjunctivae are normal.   Nose: No congestion/rhinnorhea. Mouth/Throat: Mucous membranes are moist.    Cardiovascular: Normal rate, regular rhythm. Grossly normal heart sounds.  Good peripheral circulation. Respiratory: Normal respiratory effort.  No retractions.  Gastrointestinal: Soft and nontender. No distention.  No CVA tenderness.  Musculoskeletal:  Warm and well perfused Neurologic:  Normal speech and language. No gross focal neurologic deficits are appreciated.  Skin:  Skin is warm, dry and intact. No rash noted. Psychiatric: Mood and affect are normal. Speech and behavior are  normal.  ____________________________________________   LABS (all labs ordered are listed, but only abnormal results are displayed)  Labs Reviewed  COMPREHENSIVE METABOLIC PANEL - Abnormal; Notable for the following components:      Result Value   Potassium 3.4 (*)    CO2 21 (*)    Glucose, Bld 146 (*)    Total Protein 8.5 (*)    All other components within normal limits  CBC - Abnormal; Notable for the following components:   WBC 12.0 (*)    MCV 79.0 (*)    MCH 25.6 (*)    RDW 16.1 (*)    All other components within normal limits  URINALYSIS, COMPLETE (UACMP) WITH MICROSCOPIC - Abnormal; Notable for the following components:   Color, Urine YELLOW (*)    APPearance HAZY (*)    Squamous Epithelial / LPF 0-5 (*)    All other components within normal limits  LIPASE, BLOOD   ____________________________________________  EKG   ____________________________________________  RADIOLOGY  None ____________________________________________   PROCEDURES  Procedure(s) performed: No  Procedures   Critical Care performed: No ____________________________________________   INITIAL IMPRESSION / ASSESSMENT AND PLAN / ED COURSE  Pertinent labs & imaging results that were available during my care of the patient were reviewed by me and considered in my medical decision making (see chart for details).  Patient quite well-appearing with reassuring exam, vital signs unremarkable.  Lab work significant only for very mildly elevated white blood cell count of unknown significance.  Given only 3 days of symptoms recommend continued conservative management, instructed the patient on how to take Imodium properly, outpatient follow-up as needed    ____________________________________________   FINAL CLINICAL IMPRESSION(S) / ED DIAGNOSES  Final diagnoses:  Diarrhea of presumed infectious origin        Note:  This document was prepared using Dragon voice recognition software and  may include unintentional dictation errors.    Jene Every, MD 10/21/17 1345

## 2017-10-21 NOTE — ED Triage Notes (Signed)
C/O diarrhea x 4 days.  Symptoms include nausea.  Denies abdominal pain.

## 2022-02-28 ENCOUNTER — Emergency Department (HOSPITAL_COMMUNITY)
Admission: EM | Admit: 2022-02-28 | Discharge: 2022-03-01 | Disposition: A | Discharge status: Home / Self Care | Payer: No Typology Code available for payment source | Attending: Emergency Medicine | Admitting: Emergency Medicine

## 2022-02-28 ENCOUNTER — Emergency Department (HOSPITAL_COMMUNITY): Payer: No Typology Code available for payment source

## 2022-02-28 ENCOUNTER — Other Ambulatory Visit: Payer: Self-pay

## 2022-02-28 DIAGNOSIS — R631 Polydipsia: Secondary | ICD-10-CM | POA: Insufficient documentation

## 2022-02-28 DIAGNOSIS — Z5321 Procedure and treatment not carried out due to patient leaving prior to being seen by health care provider: Secondary | ICD-10-CM | POA: Diagnosis not present

## 2022-02-28 DIAGNOSIS — R079 Chest pain, unspecified: Secondary | ICD-10-CM | POA: Diagnosis not present

## 2022-02-28 DIAGNOSIS — Z794 Long term (current) use of insulin: Secondary | ICD-10-CM | POA: Diagnosis not present

## 2022-02-28 DIAGNOSIS — R3589 Other polyuria: Secondary | ICD-10-CM | POA: Insufficient documentation

## 2022-02-28 DIAGNOSIS — R739 Hyperglycemia, unspecified: Secondary | ICD-10-CM | POA: Diagnosis not present

## 2022-02-28 LAB — I-STAT VENOUS BLOOD GAS, ED
Acid-Base Excess: 4 mmol/L — ABNORMAL HIGH (ref 0.0–2.0)
Bicarbonate: 24.7 mmol/L (ref 20.0–28.0)
Calcium, Ion: 0.88 mmol/L — CL (ref 1.15–1.40)
HCT: 42 % (ref 39.0–52.0)
Hemoglobin: 14.3 g/dL (ref 13.0–17.0)
O2 Saturation: 99 %
Potassium: 4.7 mmol/L (ref 3.5–5.1)
Sodium: 131 mmol/L — ABNORMAL LOW (ref 135–145)
TCO2: 25 mmol/L (ref 22–32)
pCO2, Ven: 25.7 mmHg — ABNORMAL LOW (ref 44–60)
pH, Ven: 7.591 — ABNORMAL HIGH (ref 7.25–7.43)
pO2, Ven: 107 mmHg — ABNORMAL HIGH (ref 32–45)

## 2022-02-28 LAB — CBC WITH DIFFERENTIAL/PLATELET
Abs Immature Granulocytes: 0.02 10*3/uL (ref 0.00–0.07)
Basophils Absolute: 0 10*3/uL (ref 0.0–0.1)
Basophils Relative: 0 %
Eosinophils Absolute: 0.1 10*3/uL (ref 0.0–0.5)
Eosinophils Relative: 2 %
HCT: 42.5 % (ref 39.0–52.0)
Hemoglobin: 14 g/dL (ref 13.0–17.0)
Immature Granulocytes: 0 %
Lymphocytes Relative: 31 %
Lymphs Abs: 2.7 10*3/uL (ref 0.7–4.0)
MCH: 26.3 pg (ref 26.0–34.0)
MCHC: 32.9 g/dL (ref 30.0–36.0)
MCV: 79.9 fL — ABNORMAL LOW (ref 80.0–100.0)
Monocytes Absolute: 0.7 10*3/uL (ref 0.1–1.0)
Monocytes Relative: 8 %
Neutro Abs: 5.1 10*3/uL (ref 1.7–7.7)
Neutrophils Relative %: 59 %
Platelets: 224 10*3/uL (ref 150–400)
RBC: 5.32 MIL/uL (ref 4.22–5.81)
RDW: 14 % (ref 11.5–15.5)
WBC: 8.7 10*3/uL (ref 4.0–10.5)
nRBC: 0 % (ref 0.0–0.2)

## 2022-02-28 LAB — URINALYSIS, ROUTINE W REFLEX MICROSCOPIC
Bacteria, UA: NONE SEEN
Bilirubin Urine: NEGATIVE
Glucose, UA: >=500 mg/dL — AB
Hgb urine dipstick: NEGATIVE
Ketones, ur: NEGATIVE mg/dL
Leukocytes,Ua: NEGATIVE
Nitrite: NEGATIVE
Protein, ur: NEGATIVE mg/dL
Specific Gravity, Urine: 1.023 (ref 1.005–1.030)
pH: 7 (ref 5.0–8.0)

## 2022-02-28 LAB — BETA-HYDROXYBUTYRIC ACID: Beta-Hydroxybutyric Acid: 0.14 mmol/L (ref 0.05–0.27)

## 2022-02-28 LAB — CBG MONITORING, ED: Glucose-Capillary: >600 mg/dL (ref 70–99)

## 2022-02-28 LAB — TROPONIN I (HIGH SENSITIVITY): Troponin I (High Sensitivity): 6 ng/L (ref <18)

## 2022-02-28 NOTE — ED Notes (Signed)
X2 no response °

## 2022-02-28 NOTE — ED Provider Triage Note (Signed)
Emergency Medicine Provider Triage Evaluation Note  Larry Nixon , a 51 y.o. male  was evaluated in triage.  Pt complains of high blood sugar reading and chest pain.  Patient states that he has had chest pain intermittently for the past 3 days.  He notes increased with physical activity.  Denies associated shortness of breath.  Patient also had a blood sugar reading greater than 600 at home which prompted his visit to an urgent care earlier today where he was then sent to the emergency department for further evaluation/treatment.  Patient states that he has been out of his insulin for 1 day.  He has been taking it as prescribed but has not had a primary care provider to fine-tune his insulin while at home.  He states that his blood sugars usually remain controlled at home.  Associated symptoms include polyuria, polydipsia.  Notes hospitalization due to high blood sugar in the past.  Denies fever, chills, night sweats, abdominal pain, nausea/vomiting/diarrhea.  Review of Systems  Positive: See above Negative:   Physical Exam  BP (!) 154/97 (BP Location: Right Arm)   Pulse 86   Temp 98.6 F (37 C) (Oral)   Resp 18   SpO2 97%  Gen:   Awake, no distress   Resp:  Normal effort  MSK:   Moves extremities without difficulty  Other:  No tender to palpation of abdomen.  No lower extremity edema appreciated.  Medical Decision Making  Medically screening exam initiated at 8:30 PM.  Appropriate orders placed.  Larry Nixon was informed that the remainder of the evaluation will be completed by another provider, this initial triage assessment does not replace that evaluation, and the importance of remaining in the ED until their evaluation is complete.     Peter Garter, [place] 02/28/22 2031

## 2022-02-28 NOTE — ED Triage Notes (Signed)
Pt sent here by UC for cbg >600. Pt ran out of his insulin, reports some mild chest pain, denies N/V/abd pain.

## 2022-02-28 NOTE — ED Notes (Signed)
X1 for vitals with no response 

## 2022-03-01 ENCOUNTER — Other Ambulatory Visit: Payer: Self-pay

## 2022-03-01 ENCOUNTER — Emergency Department (HOSPITAL_BASED_OUTPATIENT_CLINIC_OR_DEPARTMENT_OTHER)
Admission: EM | Admit: 2022-03-01 | Discharge: 2022-03-01 | Disposition: A | Discharge status: Home / Self Care | Payer: No Typology Code available for payment source | Source: Home / Self Care | Attending: Emergency Medicine | Admitting: Emergency Medicine

## 2022-03-01 ENCOUNTER — Encounter (HOSPITAL_BASED_OUTPATIENT_CLINIC_OR_DEPARTMENT_OTHER): Payer: Self-pay | Admitting: Emergency Medicine

## 2022-03-01 ENCOUNTER — Other Ambulatory Visit (HOSPITAL_BASED_OUTPATIENT_CLINIC_OR_DEPARTMENT_OTHER): Payer: Self-pay

## 2022-03-01 ENCOUNTER — Telehealth (HOSPITAL_BASED_OUTPATIENT_CLINIC_OR_DEPARTMENT_OTHER): Payer: Self-pay

## 2022-03-01 DIAGNOSIS — E039 Hypothyroidism, unspecified: Secondary | ICD-10-CM | POA: Insufficient documentation

## 2022-03-01 DIAGNOSIS — Z760 Encounter for issue of repeat prescription: Secondary | ICD-10-CM

## 2022-03-01 DIAGNOSIS — E1165 Type 2 diabetes mellitus with hyperglycemia: Secondary | ICD-10-CM | POA: Insufficient documentation

## 2022-03-01 DIAGNOSIS — Z794 Long term (current) use of insulin: Secondary | ICD-10-CM | POA: Insufficient documentation

## 2022-03-01 DIAGNOSIS — R739 Hyperglycemia, unspecified: Secondary | ICD-10-CM

## 2022-03-01 DIAGNOSIS — Z7984 Long term (current) use of oral hypoglycemic drugs: Secondary | ICD-10-CM | POA: Insufficient documentation

## 2022-03-01 DIAGNOSIS — R079 Chest pain, unspecified: Secondary | ICD-10-CM | POA: Diagnosis not present

## 2022-03-01 HISTORY — DX: Hypothyroidism, unspecified: E03.9

## 2022-03-01 LAB — COMPREHENSIVE METABOLIC PANEL
ALT: 20 U/L (ref 0–44)
AST: 22 U/L (ref 15–41)
Albumin: 4.2 g/dL (ref 3.5–5.0)
Alkaline Phosphatase: 162 U/L — ABNORMAL HIGH (ref 38–126)
Anion gap: 10 (ref 5–15)
BUN: 16 mg/dL (ref 6–20)
CO2: 25 mmol/L (ref 22–32)
Calcium: 9.3 mg/dL (ref 8.9–10.3)
Chloride: 100 mmol/L (ref 98–111)
Creatinine, Ser: 1.05 mg/dL (ref 0.61–1.24)
GFR, Estimated: >60 mL/min (ref >60)
Glucose, Bld: 600 mg/dL (ref 70–99)
Potassium: 5 mmol/L (ref 3.5–5.1)
Sodium: 135 mmol/L (ref 135–145)
Total Bilirubin: 1 mg/dL (ref 0.3–1.2)
Total Protein: 6.9 g/dL (ref 6.5–8.1)

## 2022-03-01 LAB — CBG MONITORING, ED: Glucose-Capillary: 451 mg/dL — ABNORMAL HIGH (ref 70–99)

## 2022-03-01 LAB — TROPONIN I (HIGH SENSITIVITY): Troponin I (High Sensitivity): 6 ng/L (ref <18)

## 2022-03-01 MED ORDER — INSULIN LISPRO (1 UNIT DIAL) 100 UNIT/ML (KWIKPEN)
PEN_INJECTOR | SUBCUTANEOUS | 11 refills | Status: DC
  Filled 2022-03-01: qty 15, 23d supply, fill #0
  Filled 2022-04-03: qty 15, 23d supply, fill #1
  Filled 2022-05-07: qty 15, 23d supply, fill #2
  Filled 2022-06-18: qty 15, 23d supply, fill #3
  Filled 2022-07-20: qty 15, 23d supply, fill #4
  Filled 2022-09-03: qty 15, 23d supply, fill #5

## 2022-03-01 MED ORDER — BLOOD GLUCOSE MONITOR KIT
PACK | 0 refills | Status: AC
  Filled 2022-03-01: qty 1, fill #0

## 2022-03-01 NOTE — ED Provider Notes (Signed)
[facility] EMERGENCY DEPARTMENT Provider Note   CSN: [id number] Arrival date & time: 03/01/22  0932     History  Chief Complaint  Patient presents with   Hyperglycemia    Larry Nixon is a 51 y.o. male.  51 year old male presents with complaint of high blood sugar.  Patient has a history of diabetes, hypothyroid, hyperlipidemia.  States that he has had difficulty obtaining his insulin secondary to insurance coverage of medications.  Patient has a list of medications that will be covered at a more affordable cash pay price with a coupon card.  Does not check his blood sugar regularly.  States that he was using 40 units of lispro once daily but was having episodes of hypoglycemia.  [facility] went [facility] yesterday for same, had lab work completed however left after an extensive wait in the ER.  He states that he has been thirsty lately and urinating more frequently however denies abdominal pain, nausea, vomiting, chest pain or other complaints or concerns today.       Home Medications Prior to Admission medications   Medication Sig Start Date End Date Taking? Authorizing Provider  blood glucose meter kit and supplies KIT Dispense based on patient and insurance preference. Use up to four times daily as directed. 03/01/22  Yes Tacy Learn, PA-C  insulin lispro (HUMALOG) 100 UNIT/ML KwikPen Take 2-3 times daily with meals 03/01/22  Yes Tacy Learn, PA-C  atorvastatin (LIPITOR) 20 MG tablet Take 20 mg by mouth daily. 12/04/21   [provider]  levothyroxine (SYNTHROID) 50 MCG tablet Take 50 mcg by mouth daily. 01/19/22   [provider]  lisinopril (ZESTRIL) 10 MG tablet Take 10 mg by mouth daily. 01/19/22   [provider]  metFORMIN (GLUCOPHAGE) 500 MG tablet SMARTSIG:1 Tablet(s) By Mouth Morning-Evening 01/19/22   [provider]      Allergies    Patient has no known allergies.    Review of Systems   Review of Systems Negative  except as per HPI Physical Exam Updated Vital Signs BP 131/89   Pulse 77   Temp 98.1 F (36.7 C)   Resp 16   Ht 5' 6"  (1.676 m)   Wt 99.8 kg   SpO2 98%   BMI 35.51 kg/m  Physical Exam Vitals and nursing note reviewed.  Constitutional:      General: He is not in acute distress.    Appearance: He is well-developed. He is not diaphoretic.  HENT:     Head: Normocephalic and atraumatic.  Cardiovascular:     Rate and Rhythm: Normal rate and regular rhythm.     Heart sounds: Normal heart sounds.  Pulmonary:     Effort: Pulmonary effort is normal.     Breath sounds: Normal breath sounds.  Abdominal:     Palpations: Abdomen is soft.     Tenderness: There is no abdominal tenderness.  Musculoskeletal:     Right lower leg: No edema.     Left lower leg: No edema.  Skin:    General: Skin is warm and dry.     Findings: No erythema or rash.  Neurological:     Mental Status: He is alert and oriented to person, place, and time.  Psychiatric:        Behavior: Behavior normal.     ED Results / Procedures / Treatments   Labs (all labs ordered are listed, but only abnormal results are displayed) Labs Reviewed  CBG MONITORING, ED -  Abnormal; Notable for the following components:      Result Value   Glucose-Capillary 451 (*)    All other components within normal limits    EKG None  Radiology DG Chest 2 View  Result Date: 02/28/2022 CLINICAL DATA:  Chest pain, hyperglycemia EXAM: CHEST - 2 VIEW COMPARISON:  04/25/2021 FINDINGS: The heart size and mediastinal contours are within normal limits. Both lungs are clear. The visualized skeletal structures are unremarkable. IMPRESSION: No active cardiopulmonary disease. Electronically Signed   By: Randa Ngo M.D.   On: 02/28/2022 20:55    Procedures Procedures    Medications Ordered in ED Medications - No data to display  ED Course/ Medical Decision Making/ A&P                           Medical Decision Making Risk OTC  drugs. Prescription drug management.   51 year old male presents for elevated blood sugar and need for refill of his insulin and affordable option.  Patient denies acute complaints at this time.  His blood sugar is checked and is 450.  His labs from yesterday are reviewed, his glucose yesterday was 600 without evidence of DKA.  Glucose has since improved and patient is otherwise feeling at baseline.  Pharmacist assisted with selecting an option for patient.  Education provided for monitoring blood sugar and regular use of medication to prevent hypoglycemic episodes.  Patient is referred to [facility] health and wellness to try to help with better diabetes management.  Also request assistance from transition of care if patient is unable to afford his medications.        Final Clinical Impression(s) / ED Diagnoses Final diagnoses:  Hyperglycemia  Medication refill    Rx / DC Orders ED Discharge Orders          Ordered    insulin lispro (HUMALOG) 100 UNIT/ML KwikPen        03/01/22 1005    blood glucose meter kit and supplies KIT        03/01/22 1005              Tacy Learn, PA-C 03/01/22 [place], [place], DO 03/01/22 1407

## 2022-03-01 NOTE — ED Triage Notes (Signed)
Pt here for hyperglycemia.  Pt went to [facility] yesterday but left due to long wait times.  Pt states his blood sugar was over 600, took 42 units of Lispro at midnight.  Has not rechecked blood sugar.

## 2022-03-01 NOTE — ED Notes (Signed)
Reviewed discharge instructions and recommendations with pt. States understanding. Ambulatory upon discharge with family. Aware of scripts to pick up at pharmacy

## 2022-03-01 NOTE — Discharge Instructions (Addendum)
Start with 22U with meals. Monitor your blood sugar to find the best dose for you. Follow up with [facility], they may be able to help manage your care.

## 2022-03-02 ENCOUNTER — Telehealth: Payer: Self-pay

## 2022-03-02 NOTE — Telephone Encounter (Signed)
RNCM spoke with patient who reports he has received his medications and plans to call [facility] on Monday to get PCP/ ED visit follow up appointment to establish care. No TOC needs at this time.

## 2022-04-02 ENCOUNTER — Other Ambulatory Visit: Payer: Self-pay

## 2022-04-03 ENCOUNTER — Other Ambulatory Visit: Payer: Self-pay

## 2022-04-03 ENCOUNTER — Other Ambulatory Visit (HOSPITAL_BASED_OUTPATIENT_CLINIC_OR_DEPARTMENT_OTHER): Payer: Self-pay

## 2022-04-03 MED ORDER — METFORMIN HCL 500 MG PO TABS
ORAL_TABLET | ORAL | 5 refills | Status: DC
  Filled 2022-04-03: qty 60, 30d supply, fill #0

## 2022-04-03 MED ORDER — ATORVASTATIN CALCIUM 20 MG PO TABS
ORAL_TABLET | ORAL | 5 refills | Status: DC
  Filled 2022-04-03: qty 30, 30d supply, fill #0

## 2022-04-03 MED ORDER — INSULIN LISPRO (1 UNIT DIAL) 100 UNIT/ML (KWIKPEN)
50.0000 [IU] | PEN_INJECTOR | Freq: Two times a day (BID) | SUBCUTANEOUS | 5 refills | Status: DC
  Filled 2022-04-03 – 2022-09-03 (×2): qty 30, 30d supply, fill #0
  Filled 2022-10-11: qty 30, 30d supply, fill #1
  Filled 2022-11-14: qty 30, 30d supply, fill #2
  Filled 2023-01-08 – 2023-03-13 (×4): qty 30, 30d supply, fill #3

## 2022-04-03 MED ORDER — LEVOTHYROXINE SODIUM 50 MCG PO TABS
ORAL_TABLET | ORAL | 5 refills | Status: DC
  Filled 2022-04-03: qty 30, 30d supply, fill #0

## 2022-04-03 MED ORDER — LISINOPRIL 10 MG PO TABS
ORAL_TABLET | ORAL | 5 refills | Status: DC
  Filled 2022-04-03: qty 30, 30d supply, fill #0

## 2022-04-05 ENCOUNTER — Ambulatory Visit: Payer: PRIVATE HEALTH INSURANCE | Admitting: Physician Assistant

## 2022-04-05 ENCOUNTER — Telehealth: Payer: Self-pay

## 2022-04-05 NOTE — Telephone Encounter (Signed)
I called patient and left voicemail, he was seen at another doctor office. Verifying if appointment is needed.

## 2022-04-13 ENCOUNTER — Emergency Department (HOSPITAL_BASED_OUTPATIENT_CLINIC_OR_DEPARTMENT_OTHER): Payer: PRIVATE HEALTH INSURANCE

## 2022-04-13 ENCOUNTER — Other Ambulatory Visit: Payer: Self-pay

## 2022-04-13 ENCOUNTER — Encounter (HOSPITAL_BASED_OUTPATIENT_CLINIC_OR_DEPARTMENT_OTHER): Payer: Self-pay | Admitting: Emergency Medicine

## 2022-04-13 ENCOUNTER — Emergency Department (HOSPITAL_BASED_OUTPATIENT_CLINIC_OR_DEPARTMENT_OTHER)
Admission: EM | Admit: 2022-04-13 | Discharge: 2022-04-14 | Disposition: A | Discharge status: Home / Self Care | Payer: PRIVATE HEALTH INSURANCE | Attending: Emergency Medicine | Admitting: Emergency Medicine

## 2022-04-13 DIAGNOSIS — M25511 Pain in right shoulder: Secondary | ICD-10-CM

## 2022-04-13 DIAGNOSIS — W240XXA Contact with lifting devices, not elsewhere classified, initial encounter: Secondary | ICD-10-CM | POA: Insufficient documentation

## 2022-04-13 DIAGNOSIS — Z794 Long term (current) use of insulin: Secondary | ICD-10-CM | POA: Insufficient documentation

## 2022-04-13 DIAGNOSIS — Z79899 Other long term (current) drug therapy: Secondary | ICD-10-CM | POA: Diagnosis not present

## 2022-04-13 DIAGNOSIS — Y990 Civilian activity done for income or pay: Secondary | ICD-10-CM | POA: Diagnosis not present

## 2022-04-13 DIAGNOSIS — Z7984 Long term (current) use of oral hypoglycemic drugs: Secondary | ICD-10-CM | POA: Diagnosis not present

## 2022-04-13 DIAGNOSIS — E039 Hypothyroidism, unspecified: Secondary | ICD-10-CM | POA: Insufficient documentation

## 2022-04-13 DIAGNOSIS — E119 Type 2 diabetes mellitus without complications: Secondary | ICD-10-CM | POA: Insufficient documentation

## 2022-04-13 NOTE — ED Provider Notes (Signed)
[facility] Provider Note   CSN: [id number] Arrival date & time: 04/13/22  2242     History  Chief Complaint  Patient presents with   Shoulder Pain    Larry Nixon is a 51 y.o. male.  Patient is a 51 year old male with past medical history of diabetes, hypothyroidism, hyperlipidemia.  Patient presenting today for evaluation of right shoulder pain.  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  He states that when he moves it on occasion he feels like it catches and does not move the way he wants it to.  He denies any numbness or tingling to the fingertips.  He denies any weakness.  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  The history is provided by the patient.       Home Medications Prior to Admission medications   Medication Sig Start Date End Date Taking? Authorizing Provider  atorvastatin (LIPITOR) 20 MG tablet Take 20 mg by mouth daily. 12/04/21   [provider]  atorvastatin (LIPITOR) 20 MG tablet Take one tablet (20 mg dose) by mouth daily. 04/02/22     blood glucose meter kit and supplies KIT Dispense based on patient and insurance preference. Use up to four times daily as directed. 03/01/22   Tacy Learn, PA-C  insulin lispro (HUMALOG) 100 UNIT/ML KwikPen Inject 22 units under the skin 3 times a day with meals 03/01/22   Suella Broad A, PA-C  insulin lispro (HUMALOG) 100 UNIT/ML KwikPen Inject 50 Units into the skin 2 (two) times daily. 04/02/22     levothyroxine (SYNTHROID) 50 MCG tablet Take 50 mcg by mouth daily. 01/19/22   [provider]  levothyroxine (SYNTHROID) 50 MCG tablet Take one tablet (50 mcg dose) by mouth daily. 04/02/22     lisinopril (ZESTRIL) 10 MG tablet Take 10 mg by mouth daily. 01/19/22   [provider]  lisinopril (ZESTRIL) 10 MG tablet Take one tablet (10  mg dose) by mouth daily. 04/02/22     metFORMIN (GLUCOPHAGE) 500 MG tablet SMARTSIG:1 Tablet(s) By Mouth Morning-Evening 01/19/22   [provider]  metFORMIN (GLUCOPHAGE) 500 MG tablet Take one tablet (500 mg dose) by mouth 2 (two) times daily. 04/02/22         Allergies    Patient has no known allergies.    Review of Systems   Review of Systems  All other systems reviewed and are negative.   Physical Exam Updated Vital Signs BP (!) 155/95 (BP Location: Left Arm)   Pulse 80   Temp 98.2 F (36.8 C) (Oral)   Resp 18   Ht 5' 6"  (1.676 m)   Wt 99.8 kg   SpO2 99%   BMI 35.51 kg/m  Physical Exam Vitals and nursing note reviewed.  Constitutional:      General: He is not in acute distress.    Appearance: Normal appearance. He is not ill-appearing.  HENT:     Head: Normocephalic and atraumatic.  Pulmonary:     Effort: Pulmonary effort is normal.  Musculoskeletal:     Comments: The right shoulder is grossly normal in appearance.  There is tenderness over the top of the shoulder at the [facility] joint.  He has good range of motion with no crepitus.  Ulnar and radial pulses are easily palpable and motor and sensation are intact throughout the entire hand.  Skin:    General: Skin is warm and dry.  Neurological:     Mental Status: He is alert.     ED Results / Procedures / Treatments   Labs (all labs ordered are listed, but only abnormal results are displayed) Labs Reviewed - No data to display  EKG None  Radiology No results found.  Procedures Procedures  {Document cardiac monitor, telemetry assessment procedure when appropriate:1}  Medications Ordered in ED Medications - No data to display  ED Course/ Medical Decision Making/ A&P                           Medical Decision Making Amount and/or Complexity of Data Reviewed Radiology: ordered.   ***  {Document critical care time when appropriate:1} {Document review of labs and clinical decision tools ie heart  score, Chads2Vasc2 etc:1}  {Document your independent review of radiology images, and any outside records:1} {Document your discussion with family members, caretakers, and with consultants:1} {Document social determinants of health affecting pt's care:1} {Document your decision making why or why not admission, treatments were needed:1} Final Clinical Impression(s) / ED Diagnoses Final diagnoses:  None    Rx / DC Orders ED Discharge Orders     None

## 2022-04-13 NOTE — ED Triage Notes (Signed)
Patient c/o intermittent right shoulder pain for the last few weeks. States "it feels like the joint has a hitch in it". Denies any known injury.

## 2022-04-14 MED ORDER — NAPROXEN 500 MG PO TABS: 500.0000 mg | ORAL_TABLET | Freq: Two times a day (BID) | ORAL | 0 refills | Status: DC

## 2022-04-14 NOTE — Discharge Instructions (Signed)
Begin taking naproxen as prescribed.  Rest.  Follow-up with primary doctor if not improving in the next 1 to 2 weeks.

## 2022-05-07 ENCOUNTER — Other Ambulatory Visit (HOSPITAL_BASED_OUTPATIENT_CLINIC_OR_DEPARTMENT_OTHER): Payer: Self-pay

## 2022-06-18 ENCOUNTER — Other Ambulatory Visit (HOSPITAL_BASED_OUTPATIENT_CLINIC_OR_DEPARTMENT_OTHER): Payer: Self-pay

## 2022-07-20 ENCOUNTER — Other Ambulatory Visit (HOSPITAL_BASED_OUTPATIENT_CLINIC_OR_DEPARTMENT_OTHER): Payer: Self-pay

## 2022-09-03 ENCOUNTER — Other Ambulatory Visit (HOSPITAL_BASED_OUTPATIENT_CLINIC_OR_DEPARTMENT_OTHER): Payer: Self-pay

## 2022-10-10 ENCOUNTER — Encounter (HOSPITAL_BASED_OUTPATIENT_CLINIC_OR_DEPARTMENT_OTHER): Payer: Self-pay | Admitting: Emergency Medicine

## 2022-10-10 ENCOUNTER — Other Ambulatory Visit: Payer: Self-pay

## 2022-10-10 ENCOUNTER — Emergency Department (HOSPITAL_BASED_OUTPATIENT_CLINIC_OR_DEPARTMENT_OTHER)
Admission: EM | Admit: 2022-10-10 | Discharge: 2022-10-10 | Disposition: A | Discharge status: Home / Self Care | Payer: Commercial Managed Care - PPO | Attending: Emergency Medicine | Admitting: Emergency Medicine

## 2022-10-10 DIAGNOSIS — H5789 Other specified disorders of eye and adnexa: Secondary | ICD-10-CM

## 2022-10-10 DIAGNOSIS — H5711 Ocular pain, right eye: Secondary | ICD-10-CM | POA: Diagnosis present

## 2022-10-10 DIAGNOSIS — Z794 Long term (current) use of insulin: Secondary | ICD-10-CM | POA: Diagnosis not present

## 2022-10-10 DIAGNOSIS — H00012 Hordeolum externum right lower eyelid: Secondary | ICD-10-CM

## 2022-10-10 MED ORDER — POLYMYXIN B-TRIMETHOPRIM 10000-0.1 UNIT/ML-% OP SOLN: 1.0000 [drp] | OPHTHALMIC | 0 refills | Status: DC

## 2022-10-10 NOTE — ED Provider Notes (Signed)
[facility] Provider Note   CSN: [id number] Arrival date & time: 10/10/22  0841     History  Chief Complaint  Patient presents with   Facial Swelling    Larry Nixon is a 52 y.o. male.  52 yo M with a chief complaints of right eye pain and drainage.  This been going on for a couple weeks.  He had a lesion to the lower eyelid that he thinks is mildly better.  He noticed that he was having some drainage from the eye and some crustiness this morning and so came to the emergency department for evaluation.  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  He denies obvious foreign body to the eye.        Home Medications Prior to Admission medications   Medication Sig Start Date End Date Taking? Authorizing Provider  trimethoprim-polymyxin b (POLYTRIM) ophthalmic solution Place 1 drop into the right eye every 4 (four) hours. 10/10/22  Yes Deno Etienne, DO  atorvastatin (LIPITOR) 20 MG tablet Take 20 mg by mouth daily. 12/04/21   [provider]  atorvastatin (LIPITOR) 20 MG tablet Take one tablet (20 mg dose) by mouth daily. 04/02/22     blood glucose meter kit and supplies KIT Dispense based on patient and insurance preference. Use up to four times daily as directed. 03/01/22   Tacy Learn, PA-C  insulin lispro (HUMALOG) 100 UNIT/ML KwikPen Inject 22 units under the skin 3 times a day with meals 03/01/22   Suella Broad A, PA-C  insulin lispro (HUMALOG) 100 UNIT/ML KwikPen Inject 50 Units into the skin 2 (two) times daily. 04/02/22     levothyroxine (SYNTHROID) 50 MCG tablet Take 50 mcg by mouth daily. 01/19/22   [provider]  levothyroxine (SYNTHROID) 50 MCG tablet Take one tablet (50 mcg dose) by mouth daily. 04/02/22     lisinopril (ZESTRIL) 10 MG tablet Take 10 mg by mouth daily. 01/19/22   [provider]  lisinopril (ZESTRIL) 10 MG tablet Take one tablet (10 mg dose) by mouth daily. 04/02/22      metFORMIN (GLUCOPHAGE) 500 MG tablet SMARTSIG:1 Tablet(s) By Mouth Morning-Evening 01/19/22   [provider]  metFORMIN (GLUCOPHAGE) 500 MG tablet Take one tablet (500 mg dose) by mouth 2 (two) times daily. 04/02/22     naproxen (NAPROSYN) 500 MG tablet Take 1 tablet (500 mg total) by mouth 2 (two) times daily. 04/14/22   Veryl Speak, MD      Allergies    Patient has no known allergies.    Review of Systems   Review of Systems  Physical Exam Updated Vital Signs BP (!) 154/98   Pulse 99   Temp 98.4 F (36.9 C) (Oral)   Resp 16   SpO2 99%  Physical Exam Vitals and nursing note reviewed.  Constitutional:      Appearance: He is well-developed.  HENT:     Head: Normocephalic and atraumatic.  Eyes:     Pupils: Pupils are equal, round, and reactive to light.     Comments: Very mild injection to the right eye.  No obvious foreign body.  Small papule to the inferior lid.  Neck:     Vascular: No JVD.  Cardiovascular:     Rate and Rhythm: Normal rate and regular rhythm.     Heart sounds: No murmur heard.    No friction rub. No gallop.  Pulmonary:     Effort:  No respiratory distress.     Breath sounds: No wheezing.  Abdominal:     General: There is no distension.     Tenderness: There is no abdominal tenderness. There is no guarding or rebound.  Musculoskeletal:        General: Normal range of motion.     Cervical back: Normal range of motion and neck supple.  Skin:    Coloration: Skin is not pale.     Findings: No rash.  Neurological:     Mental Status: He is alert and oriented to person, place, and time.  Psychiatric:        Behavior: Behavior normal.     ED Results / Procedures / Treatments   Labs (all labs ordered are listed, but only abnormal results are displayed) Labs Reviewed - No data to display  EKG None  Radiology No results found.  Procedures Procedures    Medications Ordered in ED Medications - No data to display  ED Course/ Medical  Decision Making/ A&P                             Medical Decision Making Risk Prescription drug management.   52 yo M with a chief complaints of right eye drainage.  Patient with clinically a stye, he has very mild injection to the right eye.  He is complaining of some purulent drainage.  Will start on topical antibiotics.  Given ophthalmology follow-up.  9:09 AM:  I have discussed the diagnosis/risks/treatment options with the patient.  Evaluation and diagnostic testing in the emergency department does not suggest an emergent condition requiring admission or immediate intervention beyond what has been performed at this time.  They will follow up with PCP. We also discussed returning to the ED immediately if new or worsening sx occur. We discussed the sx which are most concerning (e.g., sudden worsening pain, fever, inability to tolerate by mouth) that necessitate immediate return. Medications administered to the patient during their visit and any new prescriptions provided to the patient are listed below.  Medications given during this visit Medications - No data to display   The patient appears reasonably screen and/or stabilized for discharge and I doubt any other medical condition or other [facility] requiring further screening, evaluation, or treatment in the ED at this time prior to discharge.          Final Clinical Impression(s) / ED Diagnoses Final diagnoses:  Eye redness  Hordeolum externum of right lower eyelid    Rx / DC Orders ED Discharge Orders          Ordered    trimethoprim-polymyxin b (POLYTRIM) ophthalmic solution  Every 4 hours        10/10/22 [place], [place], DO 10/10/22 [phone]

## 2022-10-10 NOTE — ED Triage Notes (Signed)
Pt reports R lower eyelid swelling for the past 2 weeks. Denies any injury or visual complaints. Noticed some crusting on his eyelashes this am.

## 2022-10-10 NOTE — Discharge Instructions (Addendum)
Warm compresses at least 4 times a day.  Take the eyedrops as prescribed.  Please follow-up with an eye doctor in the office.

## 2022-10-11 ENCOUNTER — Other Ambulatory Visit (HOSPITAL_BASED_OUTPATIENT_CLINIC_OR_DEPARTMENT_OTHER): Payer: Self-pay

## 2022-10-12 ENCOUNTER — Other Ambulatory Visit (HOSPITAL_BASED_OUTPATIENT_CLINIC_OR_DEPARTMENT_OTHER): Payer: Self-pay

## 2022-11-13 ENCOUNTER — Other Ambulatory Visit (HOSPITAL_BASED_OUTPATIENT_CLINIC_OR_DEPARTMENT_OTHER): Payer: Self-pay

## 2022-11-14 ENCOUNTER — Other Ambulatory Visit (HOSPITAL_BASED_OUTPATIENT_CLINIC_OR_DEPARTMENT_OTHER): Payer: Self-pay

## 2023-01-08 ENCOUNTER — Other Ambulatory Visit (HOSPITAL_BASED_OUTPATIENT_CLINIC_OR_DEPARTMENT_OTHER): Payer: Self-pay

## 2023-01-09 ENCOUNTER — Other Ambulatory Visit (HOSPITAL_BASED_OUTPATIENT_CLINIC_OR_DEPARTMENT_OTHER): Payer: Self-pay

## 2023-01-09 ENCOUNTER — Other Ambulatory Visit: Payer: Self-pay

## 2023-01-09 ENCOUNTER — Other Ambulatory Visit (HOSPITAL_COMMUNITY): Payer: Self-pay

## 2023-01-11 ENCOUNTER — Encounter (HOSPITAL_BASED_OUTPATIENT_CLINIC_OR_DEPARTMENT_OTHER): Payer: Self-pay | Admitting: Emergency Medicine

## 2023-01-11 ENCOUNTER — Other Ambulatory Visit: Payer: Self-pay

## 2023-01-11 ENCOUNTER — Emergency Department (HOSPITAL_BASED_OUTPATIENT_CLINIC_OR_DEPARTMENT_OTHER)
Admission: EM | Admit: 2023-01-11 | Discharge: 2023-01-12 | Discharge status: Against Medical Advice | Payer: Commercial Managed Care - PPO | Attending: Emergency Medicine | Admitting: Emergency Medicine

## 2023-01-11 DIAGNOSIS — Z5321 Procedure and treatment not carried out due to patient leaving prior to being seen by health care provider: Secondary | ICD-10-CM | POA: Insufficient documentation

## 2023-01-11 DIAGNOSIS — I10 Essential (primary) hypertension: Secondary | ICD-10-CM | POA: Insufficient documentation

## 2023-01-11 DIAGNOSIS — R519 Headache, unspecified: Secondary | ICD-10-CM | POA: Insufficient documentation

## 2023-01-11 NOTE — ED Triage Notes (Signed)
Patient states he started with pain in both his eyes, then his whole head.  Now it is going across his forehead.  No nausea or vomiting.  Patient does have HTN and forgot to take his meds today.

## 2023-01-14 ENCOUNTER — Other Ambulatory Visit: Payer: Self-pay

## 2023-01-14 ENCOUNTER — Emergency Department (HOSPITAL_BASED_OUTPATIENT_CLINIC_OR_DEPARTMENT_OTHER)
Admission: EM | Admit: 2023-01-14 | Discharge: 2023-01-14 | Disposition: A | Discharge status: Home / Self Care | Payer: Self-pay | Attending: Emergency Medicine | Admitting: Emergency Medicine

## 2023-01-14 ENCOUNTER — Encounter (HOSPITAL_BASED_OUTPATIENT_CLINIC_OR_DEPARTMENT_OTHER): Payer: Self-pay

## 2023-01-14 ENCOUNTER — Emergency Department (HOSPITAL_BASED_OUTPATIENT_CLINIC_OR_DEPARTMENT_OTHER): Payer: Self-pay

## 2023-01-14 DIAGNOSIS — E039 Hypothyroidism, unspecified: Secondary | ICD-10-CM | POA: Insufficient documentation

## 2023-01-14 DIAGNOSIS — Z7984 Long term (current) use of oral hypoglycemic drugs: Secondary | ICD-10-CM | POA: Insufficient documentation

## 2023-01-14 DIAGNOSIS — Z794 Long term (current) use of insulin: Secondary | ICD-10-CM | POA: Insufficient documentation

## 2023-01-14 DIAGNOSIS — R519 Headache, unspecified: Secondary | ICD-10-CM

## 2023-01-14 DIAGNOSIS — I10 Essential (primary) hypertension: Secondary | ICD-10-CM | POA: Insufficient documentation

## 2023-01-14 DIAGNOSIS — E1165 Type 2 diabetes mellitus with hyperglycemia: Secondary | ICD-10-CM | POA: Insufficient documentation

## 2023-01-14 DIAGNOSIS — Z79899 Other long term (current) drug therapy: Secondary | ICD-10-CM | POA: Insufficient documentation

## 2023-01-14 LAB — CBC WITH DIFFERENTIAL/PLATELET
Abs Immature Granulocytes: 0.02 10*3/uL (ref 0.00–0.07)
Basophils Absolute: 0 10*3/uL (ref 0.0–0.1)
Basophils Relative: 0 %
Eosinophils Absolute: 0.1 10*3/uL (ref 0.0–0.5)
Eosinophils Relative: 2 %
HCT: 42.5 % (ref 39.0–52.0)
Hemoglobin: 14.6 g/dL (ref 13.0–17.0)
Immature Granulocytes: 0 %
Lymphocytes Relative: 29 %
Lymphs Abs: 2.7 10*3/uL (ref 0.7–4.0)
MCH: 27.1 pg (ref 26.0–34.0)
MCHC: 34.4 g/dL (ref 30.0–36.0)
MCV: 78.8 fL — ABNORMAL LOW (ref 80.0–100.0)
Monocytes Absolute: 0.7 10*3/uL (ref 0.1–1.0)
Monocytes Relative: 7 %
Neutro Abs: 5.8 10*3/uL (ref 1.7–7.7)
Neutrophils Relative %: 62 %
Platelets: 235 10*3/uL (ref 150–400)
RBC: 5.39 MIL/uL (ref 4.22–5.81)
RDW: 13.9 % (ref 11.5–15.5)
WBC: 9.3 10*3/uL (ref 4.0–10.5)
nRBC: 0 % (ref 0.0–0.2)

## 2023-01-14 LAB — BASIC METABOLIC PANEL
Anion gap: 10 (ref 5–15)
BUN: 10 mg/dL (ref 6–20)
CO2: 25 mmol/L (ref 22–32)
Calcium: 9.2 mg/dL (ref 8.9–10.3)
Chloride: 102 mmol/L (ref 98–111)
Creatinine, Ser: 0.84 mg/dL (ref 0.61–1.24)
GFR, Estimated: >60 mL/min (ref >60)
Glucose, Bld: 286 mg/dL — ABNORMAL HIGH (ref 70–99)
Potassium: 4 mmol/L (ref 3.5–5.1)
Sodium: 137 mmol/L (ref 135–145)

## 2023-01-14 MED ORDER — AMLODIPINE BESYLATE 5 MG PO TABS: 5.0000 mg | ORAL_TABLET | Freq: Every day | ORAL | 0 refills | Status: DC

## 2023-01-14 MED ORDER — KETOROLAC TROMETHAMINE 15 MG/ML IJ SOLN
15.0000 mg | Freq: Once | INTRAMUSCULAR | Status: AC
  Administered 2023-01-14: 15 mg via INTRAVENOUS
  Filled 2023-01-14: qty 1

## 2023-01-14 MED ORDER — SODIUM CHLORIDE 0.9 % IV BOLUS
1000.0000 mL | Freq: Once | INTRAVENOUS | Status: AC
Start: 2023-01-14 — End: 2023-01-14
  Administered 2023-01-14: 1000 mL via INTRAVENOUS

## 2023-01-14 MED ORDER — AMLODIPINE BESYLATE 5 MG PO TABS
5.0000 mg | ORAL_TABLET | Freq: Once | ORAL | Status: AC
  Administered 2023-01-14: 5 mg via ORAL
  Filled 2023-01-14: qty 1

## 2023-01-14 MED ORDER — SODIUM CHLORIDE 0.9 % IV SOLN
INTRAVENOUS | Status: DC
Start: 2023-01-14 — End: 2023-01-14

## 2023-01-14 NOTE — ED Triage Notes (Addendum)
Pt reports having a headache x 3 weeks Has not taken anything OTC for pain x 2 days Does report some tingling on top of head.   Denies N/V Pain only comes if he presses on his forehead

## 2023-01-14 NOTE — ED Provider Notes (Signed)
[facility] Provider Note   CSN: [id number] Arrival date & time: 01/14/23  1336     History  Chief Complaint  Patient presents with   Headache    Larry Nixon is a 52 y.o. male.  Pt is a 52 yo male with pmhx significant for HTN, DM, and hypothyroidism.  Pt has not been checking his bs.  He has not been taking his meds every day.  Pt has had a headache for 2-3 weeks.  Pt has not taken anything for sx.  Pt said headache started behind left eye and has radiated up to the back of his head.  He denies any vision changes.  No weakness/numbness/difficulty speaking.       Home Medications Prior to Admission medications   Medication Sig Start Date End Date Taking? Authorizing Provider  amLODipine (NORVASC) 5 MG tablet Take 1 tablet (5 mg total) by mouth daily. 01/14/23  Yes Jacalyn Lefevre, MD  atorvastatin (LIPITOR) 20 MG tablet Take 20 mg by mouth daily. 12/04/21   [provider]  atorvastatin (LIPITOR) 20 MG tablet Take one tablet (20 mg dose) by mouth daily. 04/02/22     blood glucose meter kit and supplies KIT Dispense based on patient and insurance preference. Use up to four times daily as directed. 03/01/22   Jeannie Fend, PA-C  insulin lispro (HUMALOG) 100 UNIT/ML KwikPen Inject 22 units under the skin 3 times a day with meals 03/01/22   Army Melia A, PA-C  insulin lispro (HUMALOG) 100 UNIT/ML KwikPen Inject 50 Units into the skin 2 (two) times daily. 04/02/22     levothyroxine (SYNTHROID) 50 MCG tablet Take 50 mcg by mouth daily. 01/19/22   [provider]  levothyroxine (SYNTHROID) 50 MCG tablet Take one tablet (50 mcg dose) by mouth daily. 04/02/22     lisinopril (ZESTRIL) 10 MG tablet Take 10 mg by mouth daily. 01/19/22   [provider]  lisinopril (ZESTRIL) 10 MG tablet Take one tablet (10 mg dose) by mouth daily. 04/02/22     metFORMIN (GLUCOPHAGE) 500 MG tablet SMARTSIG:1 Tablet(s) By Mouth Morning-Evening  01/19/22   [provider]  metFORMIN (GLUCOPHAGE) 500 MG tablet Take one tablet (500 mg dose) by mouth 2 (two) times daily. 04/02/22     naproxen (NAPROSYN) 500 MG tablet Take 1 tablet (500 mg total) by mouth 2 (two) times daily. 04/14/22   Geoffery Lyons, MD  trimethoprim-polymyxin b (POLYTRIM) ophthalmic solution Place 1 drop into the right eye every 4 (four) hours. 10/10/22   Melene Plan, DO      Allergies    Patient has no known allergies.    Review of Systems   Review of Systems  Neurological:  Positive for headaches.  All other systems reviewed and are negative.   Physical Exam Updated Vital Signs BP (!) 145/96   Pulse 88   Temp 98.4 F (36.9 C) (Oral)   Resp 18   Ht 5\' 6"  (1.676 m)   Wt 99.8 kg   SpO2 99%   BMI 35.51 kg/m  Physical Exam Vitals and nursing note reviewed.  Constitutional:      Appearance: He is well-developed.  HENT:     Head: Normocephalic and atraumatic.     Mouth/Throat:     Mouth: Mucous membranes are moist.     Pharynx: Oropharynx is clear.  Eyes:     Extraocular Movements: Extraocular movements intact.     Pupils: Pupils are equal, round,  and reactive to light.  Cardiovascular:     Rate and Rhythm: Normal rate and regular rhythm.     Heart sounds: Normal heart sounds.  Pulmonary:     Effort: Pulmonary effort is normal.     Breath sounds: Normal breath sounds.  Abdominal:     General: Bowel sounds are normal.     Palpations: Abdomen is soft.  Musculoskeletal:        General: Normal range of motion.     Cervical back: Normal range of motion and neck supple.  Skin:    General: Skin is warm.  Neurological:     Mental Status: He is alert and oriented to person, place, and time.  Psychiatric:        Mood and Affect: Mood normal.        Speech: Speech normal.        Behavior: Behavior normal.     ED Results / Procedures / Treatments   Labs (all labs ordered are listed, but only abnormal results are displayed) Labs Reviewed   BASIC METABOLIC PANEL - Abnormal; Notable for the following components:      Result Value   Glucose, Bld 286 (*)    All other components within normal limits  CBC WITH DIFFERENTIAL/PLATELET - Abnormal; Notable for the following components:   MCV 78.8 (*)    All other components within normal limits    EKG EKG Interpretation  Date/Time:  Monday January 14 2023 14:35:06 EDT Ventricular Rate:  85 PR Interval:  150 QRS Duration: 99 QT Interval:  366 QTC Calculation: 436 R Axis:   110 Text Interpretation: Sinus rhythm Probable left atrial enlargement Right axis deviation RSR' in V1 or V2, probably normal variant Nonspecific T abnormalities, diffuse leads No significant change since last tracing Confirmed by Jacalyn Lefevre [phone]) on 01/14/2023 2:38:52 PM  Radiology CT HEAD WO CONTRAST  Result Date: 01/14/2023 CLINICAL DATA:  Headaches EXAM: CT HEAD WITHOUT CONTRAST TECHNIQUE: Contiguous axial images were obtained from the base of the skull through the vertex without intravenous contrast. RADIATION DOSE REDUCTION: This exam was performed according to the departmental dose-optimization program which includes automated exposure control, adjustment of the mA and/or kV according to patient size and/or use of iterative reconstruction technique. COMPARISON:  Images of previous study done on 11/18/2014 are not available for comparison at the time of this report. Report for the previous study was reviewed. FINDINGS: Brain: No acute intracranial findings are seen. There are no signs of bleeding within the cranium. Ventricles are not dilated. Cisterna magna appears more prominent. Similar finding was described in the report for the previous study. Vascular: Unremarkable. Skull: No acute findings are seen. Sinuses/Orbits: Unremarkable. Other: None. IMPRESSION: No acute intracranial findings are seen. Cisterna magna appears prominent, possibly a normal variation. Electronically Signed   By: Ernie Avena  M.D.   On: 01/14/2023 15:00    Procedures Procedures    Medications Ordered in ED Medications  sodium chloride 0.9 % bolus 1,000 mL (1,000 mLs Intravenous New Bag/Given 01/14/23 1424)    And  0.9 %  sodium chloride infusion (has no administration in time range)  amLODipine (NORVASC) tablet 5 mg (has no administration in time range)  ketorolac (TORADOL) 15 MG/ML injection 15 mg (15 mg Intravenous Given 01/14/23 1430)    ED Course/ Medical Decision Making/ A&P  Medical Decision Making Amount and/or Complexity of Data Reviewed Labs: ordered. Radiology: ordered.  Risk Prescription drug management.   This patient presents to the ED for concern of headache, this involves an extensive number of treatment options, and is a complaint that carries with it a high risk of complications and morbidity.  The differential diagnosis includes htn, hyperglycemia, electrolyte abn   Co morbidities that complicate the patient evaluation  HTN, DM, and hypothyroidism   Additional history obtained:  Additional history obtained from epic chart review  Lab Tests:  I Ordered, and personally interpreted labs.  The pertinent results include:  cbc nl, bmp nl other than bs elevated at 286   Imaging Studies ordered:  I ordered imaging studies including ct head  I independently visualized and interpreted imaging which showed  No acute intracranial findings are seen. Cisterna magna appears  prominent, possibly a normal variation.   I agree with the radiologist interpretation   Cardiac Monitoring:  The patient was maintained on a cardiac monitor.  I personally viewed and interpreted the cardiac monitored which showed an underlying rhythm of: nsr   Medicines ordered and prescription drug management:  I ordered medication including ivfs/toradol  for sx  Reevaluation of the patient after these medicines showed that the patient improved I have reviewed the patients home  medicines and have made adjustments as needed   Test Considered:  ct   Critical Interventions:  Pain control   Problem List / ED Course:  Headache:  likely from htn and hyperglycemia.  Pt is started on amlodipine to take as well.  He is stable for d/c.  Return if worse.  F/u with pcp.   Reevaluation:  After the interventions noted above, I reevaluated the patient and found that they have :improved   Social Determinants of Health:  Lives at home; no insurance   Dispostion:  After consideration of the diagnostic results and the patients response to treatment, I feel that the patent would benefit from discharge with outpatient f/u.          Final Clinical Impression(s) / ED Diagnoses Final diagnoses:  Hyperglycemia due to diabetes mellitus (HCC)  Hypertension, unspecified type  Acute nonintractable headache, unspecified headache type    Rx / DC Orders ED Discharge Orders          Ordered    amLODipine (NORVASC) 5 MG tablet  Daily        01/14/23 1508              Jacalyn Lefevre, MD 01/14/23 [phone]

## 2023-02-08 ENCOUNTER — Other Ambulatory Visit (HOSPITAL_BASED_OUTPATIENT_CLINIC_OR_DEPARTMENT_OTHER): Payer: Self-pay

## 2023-02-19 ENCOUNTER — Other Ambulatory Visit (HOSPITAL_BASED_OUTPATIENT_CLINIC_OR_DEPARTMENT_OTHER): Payer: Self-pay

## 2023-03-13 ENCOUNTER — Other Ambulatory Visit (HOSPITAL_BASED_OUTPATIENT_CLINIC_OR_DEPARTMENT_OTHER): Payer: Self-pay

## 2023-03-19 ENCOUNTER — Other Ambulatory Visit (HOSPITAL_BASED_OUTPATIENT_CLINIC_OR_DEPARTMENT_OTHER): Payer: Self-pay

## 2023-03-19 MED ORDER — INSULIN LISPRO (1 UNIT DIAL) 100 UNIT/ML (KWIKPEN): 22.0000 [IU] | PEN_INJECTOR | Freq: Three times a day (TID) | SUBCUTANEOUS | 7 refills | Status: DC

## 2023-04-26 ENCOUNTER — Other Ambulatory Visit (HOSPITAL_BASED_OUTPATIENT_CLINIC_OR_DEPARTMENT_OTHER): Payer: Self-pay

## 2023-04-29 ENCOUNTER — Other Ambulatory Visit (HOSPITAL_BASED_OUTPATIENT_CLINIC_OR_DEPARTMENT_OTHER): Payer: Self-pay

## 2023-05-12 ENCOUNTER — Other Ambulatory Visit: Payer: Self-pay

## 2023-05-12 ENCOUNTER — Ambulatory Visit
Admission: EM | Admit: 2023-05-12 | Discharge: 2023-05-12 | Disposition: A | Discharge status: Home / Self Care | Payer: Self-pay | Attending: Family Medicine | Admitting: Family Medicine

## 2023-05-12 DIAGNOSIS — E1165 Type 2 diabetes mellitus with hyperglycemia: Secondary | ICD-10-CM | POA: Diagnosis not present

## 2023-05-12 DIAGNOSIS — E1110 Type 2 diabetes mellitus with ketoacidosis without coma: Secondary | ICD-10-CM | POA: Insufficient documentation

## 2023-05-12 DIAGNOSIS — R059 Cough, unspecified: Secondary | ICD-10-CM | POA: Diagnosis not present

## 2023-05-12 LAB — HEMOGLOBIN A1C: Hemoglobin A1C: 13.5

## 2023-05-12 NOTE — Discharge Instructions (Signed)
Go to ER

## 2023-05-12 NOTE — ED Notes (Signed)
Patient is being discharged from the Urgent Care and sent to the Emergency Department via pov . Per Dr. Delton See, patient is in need of higher level of care due to blood sugar. Patient is aware and verbalizes understanding of plan of care.  Vitals:   05/12/23 1522  BP: (!) 139/93  Pulse: (!) 119  Resp: 16  Temp: 98.4 F (36.9 C)  SpO2: 96%

## 2023-05-12 NOTE — ED Provider Notes (Signed)
BP (!) 139/93 (BP Location: Left Arm)   Pulse (!) 119   Temp 98.4 F (36.9 C) (Oral)   Resp 16   SpO2 96%   Chart is reviewed.  Patient has uncontrolled diabetes with his last hemoglobin A1c being 13.  He tells me he ran out of insulin.  He is feeling fatigued, polyuria, thirst.  We attempted to get a blood sugar on him and it was over 500 (our machine limit)  Patient does not have health insurance.  I gave him a financial aid form and referred him to the emergency room for care today   Larry Moore, MD 05/12/23 1537

## 2023-05-12 NOTE — ED Triage Notes (Signed)
Out of insulin for few weeks, blood sugar has been high. Has been sleeping more lately.

## 2023-05-12 NOTE — ED Notes (Signed)
Cbg result on glucometer is error; will not read. Dr. Delton See notified.

## 2023-05-13 DIAGNOSIS — E1110 Type 2 diabetes mellitus with ketoacidosis without coma: Secondary | ICD-10-CM | POA: Diagnosis not present

## 2023-05-14 ENCOUNTER — Other Ambulatory Visit (HOSPITAL_BASED_OUTPATIENT_CLINIC_OR_DEPARTMENT_OTHER): Payer: Self-pay

## 2023-05-14 DIAGNOSIS — Z91120 Patient's intentional underdosing of medication regimen due to financial hardship: Secondary | ICD-10-CM | POA: Diagnosis not present

## 2023-05-14 DIAGNOSIS — E669 Obesity, unspecified: Secondary | ICD-10-CM | POA: Diagnosis not present

## 2023-05-14 DIAGNOSIS — Z794 Long term (current) use of insulin: Secondary | ICD-10-CM | POA: Diagnosis not present

## 2023-05-14 DIAGNOSIS — Z7984 Long term (current) use of oral hypoglycemic drugs: Secondary | ICD-10-CM | POA: Diagnosis not present

## 2023-05-14 DIAGNOSIS — Z79899 Other long term (current) drug therapy: Secondary | ICD-10-CM | POA: Diagnosis not present

## 2023-05-14 DIAGNOSIS — Z1152 Encounter for screening for COVID-19: Secondary | ICD-10-CM | POA: Diagnosis not present

## 2023-05-14 DIAGNOSIS — E1110 Type 2 diabetes mellitus with ketoacidosis without coma: Secondary | ICD-10-CM | POA: Diagnosis not present

## 2023-05-14 DIAGNOSIS — Z79890 Hormone replacement therapy (postmenopausal): Secondary | ICD-10-CM | POA: Diagnosis not present

## 2023-05-14 DIAGNOSIS — E871 Hypo-osmolality and hyponatremia: Secondary | ICD-10-CM | POA: Diagnosis not present

## 2023-05-14 DIAGNOSIS — T383X6A Underdosing of insulin and oral hypoglycemic [antidiabetic] drugs, initial encounter: Secondary | ICD-10-CM | POA: Diagnosis not present

## 2023-05-14 DIAGNOSIS — Z6833 Body mass index (BMI) 33.0-33.9, adult: Secondary | ICD-10-CM | POA: Diagnosis not present

## 2023-05-14 DIAGNOSIS — E039 Hypothyroidism, unspecified: Secondary | ICD-10-CM | POA: Diagnosis not present

## 2023-05-14 DIAGNOSIS — Z5971 Insufficient health insurance coverage: Secondary | ICD-10-CM | POA: Diagnosis not present

## 2023-05-14 LAB — BASIC METABOLIC PANEL: Creatinine: 0.8 (ref 0.6–1.3)

## 2023-05-14 MED ORDER — INSULIN LISPRO PROT & LISPRO (75-25 MIX) 100 UNIT/ML KWIKPEN
40.0000 [IU] | PEN_INJECTOR | Freq: Two times a day (BID) | SUBCUTANEOUS | 5 refills | Status: DC
  Filled 2023-05-14: qty 15, 19d supply, fill #0
  Filled 2023-06-20 (×2): qty 15, 19d supply, fill #1
  Filled 2023-08-02: qty 15, 15d supply, fill #0
  Filled 2023-09-04: qty 15, 15d supply, fill #1
  Filled 2023-10-07: qty 15, 15d supply, fill #2
  Filled 2023-10-28: qty 15, 15d supply, fill #3

## 2023-05-17 ENCOUNTER — Ambulatory Visit: Payer: Self-pay | Admitting: Family Medicine

## 2023-05-31 ENCOUNTER — Encounter: Payer: Self-pay | Admitting: Family Medicine

## 2023-05-31 ENCOUNTER — Ambulatory Visit (INDEPENDENT_AMBULATORY_CARE_PROVIDER_SITE_OTHER): Payer: Self-pay | Admitting: Family Medicine

## 2023-05-31 VITALS — BP 156/91 | HR 73 | Temp 98.2°F | Resp 18 | Ht 67.0 in | Wt 224.9 lb

## 2023-05-31 DIAGNOSIS — Z7984 Long term (current) use of oral hypoglycemic drugs: Secondary | ICD-10-CM

## 2023-05-31 DIAGNOSIS — I10 Essential (primary) hypertension: Secondary | ICD-10-CM

## 2023-05-31 DIAGNOSIS — Z7689 Persons encountering health services in other specified circumstances: Secondary | ICD-10-CM

## 2023-05-31 DIAGNOSIS — R058 Other specified cough: Secondary | ICD-10-CM

## 2023-05-31 DIAGNOSIS — E1110 Type 2 diabetes mellitus with ketoacidosis without coma: Secondary | ICD-10-CM

## 2023-05-31 MED ORDER — GLIPIZIDE ER 10 MG PO TB24
10.0000 mg | ORAL_TABLET | Freq: Two times a day (BID) | ORAL | 1 refills | Status: DC
Start: 2023-05-31 — End: 2024-01-30

## 2023-05-31 MED ORDER — LOSARTAN POTASSIUM 50 MG PO TABS
50.0000 mg | ORAL_TABLET | Freq: Every day | ORAL | 1 refills | Status: DC
Start: 2023-05-31 — End: 2023-08-16

## 2023-05-31 NOTE — Progress Notes (Signed)
New Patient Office Visit  Subjective    Patient ID: Larry Nixon, male    DOB: 1971/07/04  Age: 52 y.o. MRN: [id number]  CC:  Chief Complaint  Patient presents with   Establish Care    Patient is here to establish care with a new PCP, Patient is here today for a follow up from [facility] ER he states that he was in Diabetic Ketoacidosis    HPI Larry Nixon presents to establish care. Pt is new to me.  Pt is here for hospital follow up and to establish care.  He recently went to urgent care on 9/29 for medication refills. He has hx of HTN and DM. He was noted to be in DKA and was referred to [facility] ER from the urgent care. He was admitted on 9/29 and stayed in the hospital until 10/1 for DKA treatment. He had his medications refilled. He reports he's been taking his medicines consistently since leaving the hospital. He did admit to not always taking them due to side effects. He reported diarrhea with the Metformin. He is also taking Humalog mix 40 units BID. He reports his sugars are running in the 200s since leaving the hospital. A1c in the hospital was 13.2  He also reports a dry cough for the last month. He is noted to be on Lisinopril 10mg  daily for HTN and DM.    Outpatient Encounter Medications as of 05/31/2023  Medication Sig   atorvastatin (LIPITOR) 20 MG tablet Take 20 mg by mouth daily.   blood glucose meter kit and supplies KIT Dispense based on patient and insurance preference. Use up to four times daily as directed.   Insulin Lispro Prot & Lispro (HUMALOG 75/25 MIX) (75-25) 100 UNIT/ML Kwikpen Inject 40 Units into the skin 2 (two) times daily with meals. Take up to 50 units twice daily based on sliding scale.   levothyroxine (SYNTHROID) 50 MCG tablet Take one tablet (50 mcg dose) by mouth daily.   lisinopril (ZESTRIL) 10 MG tablet Take one tablet (10 mg dose) by mouth daily.   metFORMIN (GLUCOPHAGE) 500 MG tablet Take one tablet (500 mg dose) by mouth 2 (two) times  daily.   [DISCONTINUED] insulin lispro (HUMALOG) 100 UNIT/ML KwikPen Inject 22 units under the skin 3 times a day with meals   [DISCONTINUED] insulin lispro (HUMALOG) 100 UNIT/ML KwikPen Inject 50 Units into the skin 2 (two) times daily.   amLODipine (NORVASC) 5 MG tablet Take 1 tablet (5 mg total) by mouth daily. (Patient not taking: Reported on 05/31/2023)   [DISCONTINUED] insulin lispro (HUMALOG) 100 UNIT/ML KwikPen Inject 22 Units into the skin 3 (three) times daily with meals.   No facility-administered encounter medications on file as of 05/31/2023.    Past Medical History:  Diagnosis Date   Diabetes mellitus without complication (HCC)    Hypertension    Hypothyroid     History reviewed. No pertinent surgical history.  Family History  Problem Relation Age of Onset   Hypertension Father     Social History   Socioeconomic History   Marital status: Single    Spouse name: Not on file   Number of children: Not on file   Years of education: Not on file   Highest education level: Not on file  Occupational History   Not on file  Tobacco Use   Smoking status: Never    Passive exposure: Never   Smokeless tobacco: Never  Vaping Use   Vaping status: Never Used  Substance and Sexual Activity   Alcohol use: No   Drug use: No   Sexual activity: Not on file  Other Topics Concern   Not on file  Social History Narrative   Not on file   Social Determinants of Health   Financial Resource Strain: Not on file  Food Insecurity: No Food Insecurity (04/02/2022)   Received from [facility], [facility]   Hunger Vital Sign    Worried About Running Out of Food in the Last Year: Never true    Ran Out of Food in the Last Year: Never true  Transportation Needs: Unmet Transportation Needs (05/13/2023)   Received from [facility] - Transportation    Lack of Transportation (Medical): Yes    Lack of Transportation (Non-Medical): No  Physical Activity: Not on file   Stress: Not on file  Social Connections: Unknown (03/23/2022)   Received from [facility], [facility]   Social Network    Social Network: Not on file  Intimate Partner Violence: Not At Risk (05/12/2023)   Received from [facility]   HITS    Over the last 12 months how often did your partner physically hurt you?: 1    Over the last 12 months how often did your partner insult you or talk down to you?: 1    Over the last 12 months how often did your partner threaten you with physical harm?: 1    Over the last 12 months how often did your partner scream or curse at you?: 1    Review of Systems  All other systems reviewed and are negative.      Objective    BP (!) 156/91   Pulse 73   Temp 98.2 F (36.8 C) (Oral)   Resp 18   Ht 5\' 7"  (1.702 m)   Wt 224 lb 14.4 oz (102 kg)   SpO2 98%   BMI 35.22 kg/m   Physical Exam Vitals and nursing note reviewed.  Constitutional:      Appearance: Normal appearance. He is normal weight.  HENT:     Head: Normocephalic and atraumatic.     Right Ear: External ear normal.     Left Ear: External ear normal.     Nose: Nose normal.     Mouth/Throat:     Mouth: Mucous membranes are moist.     Pharynx: Oropharynx is clear.  Eyes:     Conjunctiva/sclera: Conjunctivae normal.     Pupils: Pupils are equal, round, and reactive to light.  Cardiovascular:     Rate and Rhythm: Normal rate and regular rhythm.     Pulses: Normal pulses.     Heart sounds: Normal heart sounds.  Pulmonary:     Effort: Pulmonary effort is normal.     Breath sounds: Normal breath sounds.  Skin:    General: Skin is warm.     Capillary Refill: Capillary refill takes less than 2 seconds.  Neurological:     General: No focal deficit present.     Mental Status: He is alert and oriented to person, place, and time. Mental status is at baseline.  Psychiatric:        Mood and Affect: Mood normal.        Behavior: Behavior normal.        Thought Content: Thought  content normal.        Judgment: Judgment normal.   Last metabolic panel Lab Results  Component Value Date   GLUCOSE 286 (  H) 01/14/2023   NA 137 01/14/2023   K 4.0 01/14/2023   CL 102 01/14/2023   CO2 25 01/14/2023   BUN 10 01/14/2023   CREATININE 0.8 05/14/2023   GFRNONAA >60 01/14/2023   CALCIUM 9.2 01/14/2023   PROT 6.9 02/28/2022   ALBUMIN 4.2 02/28/2022   BILITOT 1.0 02/28/2022   ALKPHOS 162 (H) 02/28/2022   AST 22 02/28/2022   ALT 20 02/28/2022   ANIONGAP 10 01/14/2023   Last hemoglobin A1c Lab Results  Component Value Date   HGBA1C 13.5 05/12/2023        Assessment & Plan:   Problem List Items Addressed This Visit   None Encounter to establish care with new doctor  Diabetic ketoacidosis without coma associated with type 2 diabetes mellitus (HCC) -     glipiZIDE ER; Take 1 tablet (10 mg total) by mouth 2 (two) times daily.  Dispense: 180 tablet; Refill: 1 -     Ambulatory referral to Optometry  Benign essential hypertension -     Losartan Potassium; Take 1 tablet (50 mg total) by mouth daily.  Dispense: 90 tablet; Refill: 1  Dry cough   Reviewed hospital notes and medications. Pt reported diarrhea with the Metformin which caused noncompliance. Discontinued this and started him on Glipizide 10mg  BID. To continue Humalog 40 units BID and follow up in 2 months for recheck of A1c. Last A1c 13.5 in Sept.  Refer for diabetic eye exam. Pt reports he has no insurance but will inquire about cost. He also mentions dry cough. Could be from ACEI Lisinopril? Will change this to Losartan and raise to 50mg  for HTN control as his blood pressure isn't at goal this morning.    No follow-ups on file.   Suzan Slick, MD

## 2023-06-20 ENCOUNTER — Other Ambulatory Visit (HOSPITAL_BASED_OUTPATIENT_CLINIC_OR_DEPARTMENT_OTHER): Payer: Self-pay

## 2023-06-20 ENCOUNTER — Other Ambulatory Visit: Payer: Self-pay

## 2023-06-20 ENCOUNTER — Other Ambulatory Visit: Payer: Self-pay | Admitting: Family Medicine

## 2023-06-21 ENCOUNTER — Other Ambulatory Visit (HOSPITAL_BASED_OUTPATIENT_CLINIC_OR_DEPARTMENT_OTHER): Payer: Self-pay

## 2023-06-24 ENCOUNTER — Other Ambulatory Visit: Payer: Self-pay

## 2023-06-24 MED ORDER — ATORVASTATIN CALCIUM 20 MG PO TABS
20.0000 mg | ORAL_TABLET | Freq: Every day | ORAL | 1 refills | Status: DC
  Filled 2023-06-24: qty 90, 90d supply, fill #0

## 2023-06-24 NOTE — Telephone Encounter (Signed)
Unable to refill. Rx written by historical provider.

## 2023-07-03 ENCOUNTER — Other Ambulatory Visit: Payer: Self-pay

## 2023-07-09 ENCOUNTER — Ambulatory Visit
Admission: EM | Admit: 2023-07-09 | Discharge: 2023-07-09 | Disposition: A | Discharge status: Other Acute Inpatient Hospital | Payer: 59 | Attending: Family Medicine | Admitting: Family Medicine

## 2023-07-09 ENCOUNTER — Emergency Department (HOSPITAL_BASED_OUTPATIENT_CLINIC_OR_DEPARTMENT_OTHER)
Admission: EM | Admit: 2023-07-09 | Discharge: 2023-07-09 | Disposition: A | Discharge status: Home / Self Care | Payer: 59 | Attending: Emergency Medicine | Admitting: Emergency Medicine

## 2023-07-09 ENCOUNTER — Emergency Department (HOSPITAL_BASED_OUTPATIENT_CLINIC_OR_DEPARTMENT_OTHER): Payer: 59

## 2023-07-09 ENCOUNTER — Other Ambulatory Visit: Payer: Self-pay

## 2023-07-09 ENCOUNTER — Encounter (HOSPITAL_BASED_OUTPATIENT_CLINIC_OR_DEPARTMENT_OTHER): Payer: Self-pay

## 2023-07-09 DIAGNOSIS — Z794 Long term (current) use of insulin: Secondary | ICD-10-CM | POA: Diagnosis not present

## 2023-07-09 DIAGNOSIS — Z7984 Long term (current) use of oral hypoglycemic drugs: Secondary | ICD-10-CM | POA: Diagnosis not present

## 2023-07-09 DIAGNOSIS — I10 Essential (primary) hypertension: Secondary | ICD-10-CM | POA: Insufficient documentation

## 2023-07-09 DIAGNOSIS — R079 Chest pain, unspecified: Secondary | ICD-10-CM

## 2023-07-09 DIAGNOSIS — R0789 Other chest pain: Secondary | ICD-10-CM | POA: Diagnosis not present

## 2023-07-09 DIAGNOSIS — E119 Type 2 diabetes mellitus without complications: Secondary | ICD-10-CM | POA: Diagnosis not present

## 2023-07-09 DIAGNOSIS — Z79899 Other long term (current) drug therapy: Secondary | ICD-10-CM | POA: Insufficient documentation

## 2023-07-09 LAB — TROPONIN I (HIGH SENSITIVITY)
Troponin I (High Sensitivity): 7 ng/L (ref <18)
Troponin I (High Sensitivity): 5 ng/L (ref <18)

## 2023-07-09 LAB — BASIC METABOLIC PANEL
Anion gap: 6 (ref 5–15)
BUN: 8 mg/dL (ref 6–20)
CO2: 31 mmol/L (ref 22–32)
Calcium: 10 mg/dL (ref 8.9–10.3)
Chloride: 104 mmol/L (ref 98–111)
Creatinine, Ser: 0.86 mg/dL (ref 0.61–1.24)
GFR, Estimated: >60 mL/min (ref >60)
Glucose, Bld: 198 mg/dL — ABNORMAL HIGH (ref 70–99)
Potassium: 3.9 mmol/L (ref 3.5–5.1)
Sodium: 141 mmol/L (ref 135–145)

## 2023-07-09 LAB — CBC
HCT: 41.2 % (ref 39.0–52.0)
Hemoglobin: 13.5 g/dL (ref 13.0–17.0)
MCH: 26.3 pg (ref 26.0–34.0)
MCHC: 32.8 g/dL (ref 30.0–36.0)
MCV: 80.3 fL (ref 80.0–100.0)
Platelets: 247 10*3/uL (ref 150–400)
RBC: 5.13 MIL/uL (ref 4.22–5.81)
RDW: 14.2 % (ref 11.5–15.5)
WBC: 9.3 10*3/uL (ref 4.0–10.5)
nRBC: 0 % (ref 0.0–0.2)

## 2023-07-09 NOTE — Discharge Instructions (Addendum)
Your EKG revealed nonspecific changes, your cardiac enzymes however were normal.  Your chest x-ray revealed a stable lung nodule seen on previous radiographs.

## 2023-07-09 NOTE — ED Triage Notes (Signed)
C/o chest pain this morning upon awakening, states lasted on and off x 1 hour. Went to UC and sent here for further eval. Denies pain at arrival.

## 2023-07-09 NOTE — ED Notes (Signed)
Patient is being discharged from the Urgent Care and sent to the Emergency Department via pov . Per Ragan np, patient is in need of higher level of care due to chest pain. Patient is aware and verbalizes understanding of plan of care. There were no vitals filed for this visit.

## 2023-07-09 NOTE — ED Provider Notes (Signed)
[facility]    CSN: [id number] Arrival date & time: 07/09/23  0857      History   Chief Complaint No chief complaint on file.   HPI Larry Nixon is a 52 y.o. male.   HPI 52 year old male presents with chest pain that has been constant upon arising reports dull in nature 2 out of 10 in the left midsternal region.  Patient denies shortness of breath lightheadedness, dizziness, presyncope/syncopal episodes, claudication, or lower extremity edema.  PMH significant for obesity, HTN, ARF, and T2DM.  Past Medical History:  Diagnosis Date   Diabetes mellitus without complication (HCC)    Hypertension    Hypothyroid     Patient Active Problem List   Diagnosis Date Noted   Diabetic ketoacidosis without coma associated with type 2 diabetes mellitus (HCC) 05/12/2023   Benign essential hypertension 10/12/2016   OSA on CPAP 10/12/2016   ARF (acute renal failure) (HCC) 08/10/2016    History reviewed. No pertinent surgical history.     Home Medications    Prior to Admission medications   Medication Sig Start Date End Date Taking? Authorizing Provider  atorvastatin (LIPITOR) 20 MG tablet Take 1 tablet (20 mg total) by mouth daily. 06/24/23   Suzan Slick, MD  blood glucose meter kit and supplies KIT Dispense based on patient and insurance preference. Use up to four times daily as directed. 03/01/22   Jeannie Fend, PA-C  glipiZIDE (GLUCOTROL XL) 10 MG 24 hr tablet Take 1 tablet (10 mg total) by mouth 2 (two) times daily. 05/31/23   Suzan Slick, MD  Insulin Lispro Prot & Lispro (HUMALOG 75/25 MIX) (75-25) 100 UNIT/ML Kwikpen Inject 40 Units into the skin 2 (two) times daily with meals. Take up to 50 units twice daily based on sliding scale. 05/14/23     levothyroxine (SYNTHROID) 50 MCG tablet Take one tablet (50 mcg dose) by mouth daily. 04/02/22     losartan (COZAAR) 50 MG tablet Take 1 tablet (50 mg total) by mouth daily. 05/31/23   Suzan Slick, MD     Family History Family History  Problem Relation Age of Onset   Hypertension Father     Social History Social History   Tobacco Use   Smoking status: Never    Passive exposure: Never   Smokeless tobacco: Never  Vaping Use   Vaping status: Never Used  Substance Use Topics   Alcohol use: No   Drug use: No     Allergies   Lisinopril and Metformin and related   Review of Systems Review of Systems  All other systems reviewed and are negative.    Physical Exam Triage Vital Signs ED Triage Vitals [07/09/23 0904]  Encounter Vitals Group     BP      Systolic BP Percentile      Diastolic BP Percentile      Pulse      Resp      Temp      Temp src      SpO2      Weight      Height      Head Circumference      Peak Flow      Pain Score 2     Pain Loc      Pain Education      Exclude from Growth Chart    No data found.  Updated Vital Signs There were no vitals taken for this visit.  Physical Exam Vitals and nursing note reviewed.  Constitutional:      Appearance: Normal appearance. He is obese.  HENT:     Head: Normocephalic and atraumatic.     Mouth/Throat:     Mouth: Mucous membranes are moist.     Pharynx: Oropharynx is clear.  Eyes:     Extraocular Movements: Extraocular movements intact.     Conjunctiva/sclera: Conjunctivae normal.     Pupils: Pupils are equal, round, and reactive to light.  Neck:     Comments: No JVD/no bruit Cardiovascular:     Rate and Rhythm: Normal rate and regular rhythm.     Pulses: Normal pulses.     Heart sounds: Normal heart sounds. No murmur heard.    No friction rub. No gallop.     Comments: S1-S2 normal Pulmonary:     Effort: Pulmonary effort is normal.     Breath sounds: Normal breath sounds. No wheezing or rhonchi.  Musculoskeletal:        General: Normal range of motion.     Cervical back: Normal range of motion and neck supple. No tenderness.  Lymphadenopathy:     Cervical: No cervical adenopathy.   Skin:    General: Skin is warm and dry.  Neurological:     General: No focal deficit present.     Mental Status: He is alert and oriented to person, place, and time. Mental status is at baseline.  Psychiatric:        Mood and Affect: Mood normal.        Behavior: Behavior normal.      UC Treatments / Results  Labs (all labs ordered are listed, but only abnormal results are displayed) Labs Reviewed - No data to display  EKG   Radiology No results found.  Procedures Procedures (including critical care time)  Medications Ordered in UC Medications - No data to display  Initial Impression / Assessment and Plan / UC Course  I have reviewed the triage vital signs and the nursing notes.  Pertinent labs & imaging results that were available during my care of the patient were reviewed by me and considered in my medical decision making (see chart for details).     MDM: 1.  Chest pain, unspecified-EKG on file of 01/14/2023 different from today's EKG most notably T wave changes. Instructed patient to go to [facility] ED now for further evaluation of current chest pain to include lab work and imaging.  Patient agreed and verbalized understanding of these instructions and this plan of care today.  Work note provided to patient prior to discharge per request.  Final Clinical Impressions(s) / UC Diagnoses   Final diagnoses:  Chest pain, unspecified type     Discharge Instructions      Instructed patient to go to [facility] ED now for further evaluation of current chest pain to include lab work and imaging.     ED Prescriptions   None    PDMP not reviewed this encounter.   Trevor Iha, FNP 07/09/23 1000

## 2023-07-09 NOTE — ED Provider Notes (Signed)
[place] [facility] Provider Note   CSN: [id number] Arrival date & time: 07/09/23  1012     History  Chief Complaint  Patient presents with   Chest Pain    Larry Nixon is a 52 y.o. male.   Chest Pain    52 year old male with medical history significant for hypertension, diabetes mellitus on insulin, obesity presenting to the emergency department with chest pain.  The patient states that he woke up this morning with left-sided chest discomfort.  Symptoms lasted on and off for roughly 1 hour.  No ripping or tearing sensation.  No cough, fever or chills, no lower extremity swelling.  No radiation to the neck or down the arm.  No shortness of breath.  No diaphoresis or nausea.  The patient was initially seen at urgent care and was sent to the Emergency Department for further evaluation.  His chest discomfort had been 2 out of 10 in the midsternal region and slightly left-sided and has since eased off.  At the time of my evaluation, the patient denies any chest discomfort.  Home Medications Prior to Admission medications   Medication Sig Start Date End Date Taking? Authorizing Provider  atorvastatin (LIPITOR) 20 MG tablet Take 1 tablet (20 mg total) by mouth daily. 06/24/23   Suzan Slick, MD  blood glucose meter kit and supplies KIT Dispense based on patient and insurance preference. Use up to four times daily as directed. 03/01/22   Jeannie Fend, PA-C  glipiZIDE (GLUCOTROL XL) 10 MG 24 hr tablet Take 1 tablet (10 mg total) by mouth 2 (two) times daily. 05/31/23   Suzan Slick, MD  Insulin Lispro Prot & Lispro (HUMALOG 75/25 MIX) (75-25) 100 UNIT/ML Kwikpen Inject 40 Units into the skin 2 (two) times daily with meals. Take up to 50 units twice daily based on sliding scale. 05/14/23     levothyroxine (SYNTHROID) 50 MCG tablet Take one tablet (50 mcg dose) by mouth daily. 04/02/22     losartan (COZAAR) 50 MG tablet Take 1 tablet (50 mg total)  by mouth daily. 05/31/23   Suzan Slick, MD      Allergies    Lisinopril and Metformin and related    Review of Systems   Review of Systems  Cardiovascular:  Positive for chest pain.  All other systems reviewed and are negative.   Physical Exam Updated Vital Signs BP (!) 163/109   Pulse 67   Temp 98.4 F (36.9 C)   Resp (!) 24   Ht 5\' 7"  (1.702 m)   Wt 104.3 kg   SpO2 100%   BMI 36.02 kg/m  Physical Exam Vitals and nursing note reviewed.  Constitutional:      General: He is not in acute distress.    Appearance: He is well-developed. He is obese.  HENT:     Head: Normocephalic and atraumatic.  Eyes:     Conjunctiva/sclera: Conjunctivae normal.  Cardiovascular:     Rate and Rhythm: Normal rate and regular rhythm.     Pulses: Normal pulses.  Pulmonary:     Effort: Pulmonary effort is normal. No respiratory distress.     Breath sounds: Normal breath sounds.  Abdominal:     Palpations: Abdomen is soft.     Tenderness: There is no abdominal tenderness.  Musculoskeletal:        General: No swelling.     Cervical back: Neck supple.  Skin:    General: Skin is warm  and dry.     Capillary Refill: Capillary refill takes less than 2 seconds.  Neurological:     Mental Status: He is alert.  Psychiatric:        Mood and Affect: Mood normal.     ED Results / Procedures / Treatments   Labs (all labs ordered are listed, but only abnormal results are displayed) Labs Reviewed  BASIC METABOLIC PANEL - Abnormal; Notable for the following components:      Result Value   Glucose, Bld 198 (*)    All other components within normal limits  CBC  TROPONIN I (HIGH SENSITIVITY)  TROPONIN I (HIGH SENSITIVITY)    EKG EKG Interpretation Date/Time:  Tuesday July 09 2023 10:23:18 EST Ventricular Rate:  72 PR Interval:  153 QRS Duration:  88 QT Interval:  398 QTC Calculation: 436 R Axis:   120  Text Interpretation: Sinus rhythm Probable left atrial enlargement Right  axis deviation Borderline repolarization abnormality Borderline ST elevation, lateral leads Confirmed by Ernie Avena ([id number]) on 07/09/2023 12:06:43 PM  Radiology DG Chest 2 View  Result Date: 07/09/2023 CLINICAL DATA:  Chest pain. EXAM: CHEST - 2 VIEW COMPARISON:  February 28, 2022.  June 26, 2016.  October 09, 2013. FINDINGS: The heart size and mediastinal contours are within normal limits. Right lung is clear. Small nodular density is noted in left lung base. This may correspond to nodule seen on prior study of 2017 when it was larger. The visualized skeletal structures are unremarkable. IMPRESSION: Nodular density seen in left lung base. Potentially this may correspond to larger nodule seen in this area on prior radiograph of 2017 and CT scan of 2015. No other definite pulmonary abnormality is noted. Electronically Signed   By: Lupita Raider M.D.   On: 07/09/2023 11:54    Procedures Procedures    Medications Ordered in ED Medications - No data to display  ED Course/ Medical Decision Making/ A&P             HEART Score: 5                    Medical Decision Making Amount and/or Complexity of Data Reviewed Labs: ordered. Radiology: ordered.     52 year old male with medical history significant for hypertension, diabetes mellitus on insulin, obesity presenting to the emergency department with chest pain.  The patient states that he woke up this morning with left-sided chest discomfort.  Symptoms lasted on and off for roughly 1 hour.  No ripping or tearing sensation.  No cough, fever or chills, no lower extremity swelling.  No radiation to the neck or down the arm.  No shortness of breath.  No diaphoresis or nausea.  The patient was initially seen at urgent care and was sent to the Emergency Department for further evaluation.  His chest discomfort had been 2 out of 10 in the midsternal region and slightly left-sided and has since eased off.  At the time of my evaluation, the patient  denies any chest discomfort.  Medical Decision Making: Larry Nixon is a 52 y.o. male who presented to the ED today with chest pain, detailed above.  Based on patient's comorbidities, patient has a heart score of 5.    Patient placed on continuous vitals and telemetry monitoring while in ED which was reviewed periodically.  Complete initial physical exam performed, notably the patient was CTAB, intact symmetric radial pulses, no lower extremity swelling, sounds normal.   Reviewed and confirmed nursing documentation  for past medical history, family history, social history.    Initial Assessment: With the patient's presentation of left-sided chest pain, most likely diagnosis is musculoskeletal chest pain versus GERD, although ACS remains on the differential. Other diagnoses were considered including (but not limited to) pulmonary embolism, community-acquired pneumonia, aortic dissection, pneumothorax, underlying bony abnormality, anemia. These are considered less likely due to history of present illness and physical exam findings.     Aortic Dissection also considered but seems less likely based on the location, quality, onset, and severity of symptoms in this case.   Initial Plan: Evaluate for ACS with delta troponin and EKG evaluated as below  Evaluate for dissection, bony abnormality, or pneumonia with chest x-ray and screening laboratory evaluation including CBC, BMP  Further evaluation for pulmonary embolism not indicated based on his HPI not being consistent  Further evaluation for Thoracic Aortic Dissection not indicated at this time based on patient's clinical history and PE findings.   Initial Study Results: EKG was reviewed independently. Rate, rhythm, axis, intervals all examined and without medically relevant abnormality. ST segments with nonspecific changes in the lateral leads, T wave changes similar to prior  Laboratory  Delta troponin demonstrated normal values   CBC and BMP  without obvious metabolic or inflammatory abnormalities requiring further evaluation   Radiology  DG Chest 2 View  Result Date: 07/09/2023 CLINICAL DATA:  Chest pain. EXAM: CHEST - 2 VIEW COMPARISON:  February 28, 2022.  June 26, 2016.  October 09, 2013. FINDINGS: The heart size and mediastinal contours are within normal limits. Right lung is clear. Small nodular density is noted in left lung base. This may correspond to nodule seen on prior study of 2017 when it was larger. The visualized skeletal structures are unremarkable. IMPRESSION: Nodular density seen in left lung base. Potentially this may correspond to larger nodule seen in this area on prior radiograph of 2017 and CT scan of 2015. No other definite pulmonary abnormality is noted. Electronically Signed   By: Lupita Raider M.D.   On: 07/09/2023 11:54    Final Assessment and Plan: Patient has a heart score of 5, currently has remained asymptomatic and chest pain-free over 3 hours in the emergency department.  Nonspecific EKG changes with normal delta troponins.  Given the patient's moderate risk presentation, considered admission for observation and cardiology consultation for consideration for stress testing versus discharge and close follow-up with cardiology for outpatient stress testing.  After discussion of the risks, the patient would prefer to be discharged with plan for close outpatient follow-up with a cardiologist.  Low concern for aortic dissection based on symptoms and presentation and exam, low concern for PE or other acute intrathoracic abnormality as etiology of the patient's presentation.  Provided the patient with return precautions, advised close outpatient cardiology follow-up.    Final Clinical Impression(s) / ED Diagnoses Final diagnoses:  Nonspecific chest pain    Rx / DC Orders ED Discharge Orders          Ordered    Ambulatory referral to Cardiology       Comments: If you have not heard from the Cardiology  office within the next 72 hours please call [phone].   07/09/23 1318              Ernie Avena, MD 07/09/23 1318

## 2023-07-09 NOTE — Discharge Instructions (Addendum)
Instructed patient to go to [facility] ED now for further evaluation of current chest pain to include lab work and imaging.

## 2023-07-09 NOTE — ED Triage Notes (Signed)
C/o chest pain after waking up dull in nature 2/10, left midsternal region. No n/v or diaphoresis.

## 2023-07-19 ENCOUNTER — Other Ambulatory Visit: Payer: Self-pay

## 2023-07-19 ENCOUNTER — Other Ambulatory Visit: Payer: Self-pay | Admitting: Family Medicine

## 2023-07-19 ENCOUNTER — Other Ambulatory Visit (HOSPITAL_BASED_OUTPATIENT_CLINIC_OR_DEPARTMENT_OTHER): Payer: Self-pay

## 2023-07-19 ENCOUNTER — Emergency Department (HOSPITAL_BASED_OUTPATIENT_CLINIC_OR_DEPARTMENT_OTHER)
Admission: EM | Admit: 2023-07-19 | Discharge: 2023-07-19 | Disposition: A | Discharge status: Home / Self Care | Payer: 59 | Attending: Emergency Medicine | Admitting: Emergency Medicine

## 2023-07-19 ENCOUNTER — Other Ambulatory Visit (HOSPITAL_COMMUNITY): Payer: Self-pay

## 2023-07-19 ENCOUNTER — Encounter (HOSPITAL_BASED_OUTPATIENT_CLINIC_OR_DEPARTMENT_OTHER): Payer: Self-pay | Admitting: Emergency Medicine

## 2023-07-19 DIAGNOSIS — K0889 Other specified disorders of teeth and supporting structures: Secondary | ICD-10-CM | POA: Diagnosis not present

## 2023-07-19 NOTE — Discharge Instructions (Addendum)
Today you were seen for a dental problem.  Please see the attached resource guide thank you for letting us treat you today. After performing physical exam, I feel you are safe to go home. Please follow up with your PCP in the next several days and provide them with your records from this visit. Return to the Emergency Room if pain becomes severe or symptoms worsen.

## 2023-07-19 NOTE — ED Triage Notes (Signed)
Patient reports front lower tooth extraction "years ago" now feels there is possible remaining tooth "protruding" out of his lower gums. Denies pain.

## 2023-07-19 NOTE — ED Provider Notes (Signed)
[place] EMERGENCY DEPARTMENT AT [facility] Provider Note   CSN: [id number] Arrival date & time: 07/19/23  1357     History  Chief Complaint  Patient presents with   Dental Problem    Bao Brucker is a 52 y.o. male past medical history significant for tooth extraction "years ago ".  Patient now feels like there might be a possible remaining tooth in his right mandibular region.  Patient does note pain with palpating the area.  Patient denies fever, chills, bleeding, sore, abscess.  HPI     Home Medications Prior to Admission medications   Medication Sig Start Date End Date Taking? Authorizing Provider  atorvastatin (LIPITOR) 20 MG tablet Take 1 tablet (20 mg total) by mouth daily. 06/24/23   Suzan Slick, MD  blood glucose meter kit and supplies KIT Dispense based on patient and insurance preference. Use up to four times daily as directed. 03/01/22   Jeannie Fend, PA-C  glipiZIDE (GLUCOTROL XL) 10 MG 24 hr tablet Take 1 tablet (10 mg total) by mouth 2 (two) times daily. 05/31/23   Suzan Slick, MD  Insulin Lispro Prot & Lispro (HUMALOG 75/25 MIX) (75-25) 100 UNIT/ML Kwikpen Inject 40 Units into the skin 2 (two) times daily with meals. Take up to 50 units twice daily based on sliding scale. 05/14/23     levothyroxine (SYNTHROID) 50 MCG tablet Take one tablet (50 mcg dose) by mouth daily. 04/02/22     losartan (COZAAR) 50 MG tablet Take 1 tablet (50 mg total) by mouth daily. 05/31/23   Suzan Slick, MD      Allergies    Lisinopril and Metformin and related    Review of Systems   Review of Systems  HENT:  Positive for dental problem.     Physical Exam Updated Vital Signs BP (!) 154/103 (BP Location: Right Arm)   Pulse 77   Temp 98.3 F (36.8 C) (Oral)   Resp 17   Ht 5\' 7"  (1.702 m)   Wt 104.3 kg   SpO2 100%   BMI 36.02 kg/m  Physical Exam Vitals and nursing note reviewed.  Constitutional:      General: He is not in acute distress.     Appearance: He is well-developed.  HENT:     Head: Normocephalic and atraumatic.      Comments: Hard object palpated to area noted above.  No erythema, ecchymosis, or discharge noted.  Mild tenderness to palpation.  No wound on the inner mouth.     Mouth/Throat:     Dentition: No dental abscesses or gum lesions.     Pharynx: Uvula midline. No uvula swelling.  Eyes:     Conjunctiva/sclera: Conjunctivae normal.  Cardiovascular:     Rate and Rhythm: Normal rate and regular rhythm.     Heart sounds: No murmur heard. Pulmonary:     Effort: Pulmonary effort is normal. No respiratory distress.     Breath sounds: Normal breath sounds.  Abdominal:     Palpations: Abdomen is soft.     Tenderness: There is no abdominal tenderness.  Musculoskeletal:        General: No swelling.     Cervical back: Neck supple.  Skin:    General: Skin is warm and dry.     Capillary Refill: Capillary refill takes less than 2 seconds.  Neurological:     Mental Status: He is alert.  Psychiatric:        Mood and Affect: Mood  normal.     ED Results / Procedures / Treatments   Labs (all labs ordered are listed, but only abnormal results are displayed) Labs Reviewed - No data to display  EKG None  Radiology No results found.  Procedures Procedures    Medications Ordered in ED Medications - No data to display  ED Course/ Medical Decision Making/ A&P                                 Medical Decision Making  This patient presents to the ED with chief complaint(s) of dental problem with pertinent past medical history of none which further complicates the presenting complaint. The complaint involves an extensive differential diagnosis and also carries with it a high risk of complications and morbidity.    The differential diagnosis includes dental abscess, dental pain  Additional history obtained: Records reviewed Primary Care Documents  ED Course and Reassessment:  Consultation: - Consulted or  discussed management/test interpretation w/ external professional: None  Consideration for admission or further workup: Considered for admission or further workup however patient's vital and physical exam is been reassuring.  Patient is referred to outpatient dental for further assessment and treatment.  Patient has no red flag signs or symptoms concerning for PTA, epiglottitis, Ludwig angina, etc        Final Clinical Impression(s) / ED Diagnoses Final diagnoses:  Pain, dental    Rx / DC Orders ED Discharge Orders     None         Dolphus Jenny, PA-C 07/19/23 1518    Alvira Monday, MD 07/19/23 2212

## 2023-07-22 ENCOUNTER — Other Ambulatory Visit: Payer: Self-pay

## 2023-07-22 MED ORDER — LEVOTHYROXINE SODIUM 50 MCG PO TABS
50.0000 ug | ORAL_TABLET | Freq: Every day | ORAL | 5 refills | Status: DC
  Filled 2023-07-22: qty 30, 30d supply, fill #0

## 2023-07-23 ENCOUNTER — Other Ambulatory Visit: Payer: Self-pay

## 2023-07-23 ENCOUNTER — Telehealth: Payer: Self-pay

## 2023-07-23 MED ORDER — LEVOTHYROXINE SODIUM 50 MCG PO TABS: 50.0000 ug | ORAL_TABLET | Freq: Every day | ORAL | 5 refills | Status: DC

## 2023-07-23 NOTE — Telephone Encounter (Signed)
Patients medication has been refilled at requested pharmacy. Patient is aware      Copied from CRM [id number]. Topic: Clinical - Medication Refill >> Jul 23, 2023  8:48 AM Shelbie Proctor wrote: Most Recent Primary Care Visit:  Provider: Suzan Slick  Department: [facility]  Visit Type: NEW PATIENT  Date: 05/31/2023  Medication: levothyroxine (SYNTHROID)   Has the patient contacted their pharmacy? Yes, pharmacy advised pt needs to contact the office.  (Agent: If no, request that the patient contact the pharmacy for the refill. If patient does not wish to contact the pharmacy document the reason why and proceed with request.) (Agent: If yes, when and what did the pharmacy advise?)  Is this the correct pharmacy for this prescription? Yes If no, delete pharmacy and type the correct one.  This is the patient's preferred pharmacy:  [facility] [address] [place] - [address] [place] [address] Phone: [phone] Fax: [phone]   Has the prescription been filled recently? No  Is the patient out of the medication? Yes, pt has been out for about a month.  Has the patient been seen for an appointment in the last year OR does the patient have an upcoming appointment?   Can we respond through MyChart? No, pls c/b [phone]   Agent: Please be advised that Rx refills may take up to 3 business days. We ask that you follow-up with your pharmacy.

## 2023-08-01 ENCOUNTER — Other Ambulatory Visit: Payer: Self-pay

## 2023-08-02 ENCOUNTER — Other Ambulatory Visit (HOSPITAL_BASED_OUTPATIENT_CLINIC_OR_DEPARTMENT_OTHER): Payer: Self-pay

## 2023-08-02 ENCOUNTER — Other Ambulatory Visit (HOSPITAL_COMMUNITY): Payer: Self-pay

## 2023-08-16 ENCOUNTER — Encounter: Payer: Self-pay | Admitting: Family Medicine

## 2023-08-16 ENCOUNTER — Ambulatory Visit: Payer: Medicaid Other | Admitting: Family Medicine

## 2023-08-16 VITALS — BP 166/99 | HR 82 | Temp 98.1°F | Resp 18 | Ht 67.0 in | Wt 227.2 lb

## 2023-08-16 DIAGNOSIS — Z6835 Body mass index (BMI) 35.0-35.9, adult: Secondary | ICD-10-CM | POA: Diagnosis not present

## 2023-08-16 DIAGNOSIS — N5201 Erectile dysfunction due to arterial insufficiency: Secondary | ICD-10-CM

## 2023-08-16 DIAGNOSIS — E1165 Type 2 diabetes mellitus with hyperglycemia: Secondary | ICD-10-CM | POA: Diagnosis not present

## 2023-08-16 DIAGNOSIS — I10 Essential (primary) hypertension: Secondary | ICD-10-CM

## 2023-08-16 DIAGNOSIS — Z794 Long term (current) use of insulin: Secondary | ICD-10-CM

## 2023-08-16 LAB — POCT GLYCOSYLATED HEMOGLOBIN (HGB A1C): Hemoglobin A1C: 9.5 % — AB (ref 4.0–5.6)

## 2023-08-16 MED ORDER — OZEMPIC (0.25 OR 0.5 MG/DOSE) 2 MG/3ML ~~LOC~~ SOPN: 0.2500 mg | PEN_INJECTOR | SUBCUTANEOUS | 1 refills | Status: DC

## 2023-08-16 MED ORDER — LOSARTAN POTASSIUM-HCTZ 100-12.5 MG PO TABS: 1.0000 | ORAL_TABLET | Freq: Every day | ORAL | 1 refills | Status: DC

## 2023-08-16 NOTE — Progress Notes (Signed)
 Established Patient Office Visit  Subjective   Patient ID: Larry Nixon, male    DOB: 1970/10/11  Age: 53 y.o. MRN: [id number]  No chief complaint on file.   HPI  Diabetes Pt is taking Humalog  40 units BID and Glipizide  10mg  BID. Checking sugars daily and has been running 200s. Pt reports he is craving sugar and asks if there's an aide to help with this and his weight.  Hypertension Pt is on Losartan  50mg  daily for HTN. Blood pressure not at goal. Taking medicine daily. Denies current chest pains or SOB.   Review of Systems  Genitourinary:        ED  All other systems reviewed and are negative.    Objective:     There were no vitals taken for this visit. BP Readings from Last 3 Encounters:  08/16/23 (!) 166/99  07/19/23 (!) 145/89  07/09/23 (!) 162/115      Physical Exam Vitals and nursing note reviewed.  Constitutional:      Appearance: Normal appearance. He is obese.  HENT:     Head: Normocephalic and atraumatic.     Right Ear: External ear normal.     Left Ear: External ear normal.     Nose: Nose normal.     Mouth/Throat:     Mouth: Mucous membranes are moist.     Pharynx: Oropharynx is clear.  Eyes:     Conjunctiva/sclera: Conjunctivae normal.     Pupils: Pupils are equal, round, and reactive to light.  Cardiovascular:     Rate and Rhythm: Normal rate and regular rhythm.     Pulses: Normal pulses.     Heart sounds: Normal heart sounds.  Pulmonary:     Effort: Pulmonary effort is normal.     Breath sounds: Normal breath sounds.  Skin:    General: Skin is warm.     Capillary Refill: Capillary refill takes less than 2 seconds.  Neurological:     General: No focal deficit present.     Mental Status: He is alert and oriented to person, place, and time. Mental status is at baseline.  Psychiatric:        Mood and Affect: Mood normal.        Behavior: Behavior normal.        Thought Content: Thought content normal.        Judgment: Judgment normal.     No results found for any visits on 08/16/23.  Last hemoglobin A1c Lab Results  Component Value Date   HGBA1C 9.5 (A) 08/16/2023      The ASCVD Risk score (Arnett DK, et al., 2019) failed to calculate for the following reasons:   The valid total cholesterol range is 130 to 320 mg/dL    Assessment & Plan:   Problem List Items Addressed This Visit   None  Type 2 diabetes mellitus with hyperglycemia, with long-term current use of insulin  (HCC) -     Ozempic  (0.25 or 0.5 MG/DOSE); Inject 0.25 mg into the skin once a week.  Dispense: 3 mL; Refill: 1 -     POCT glycosylated hemoglobin (Hb A1C)  Primary hypertension -     Losartan  Potassium-HCTZ; Take 1 tablet by mouth daily.  Dispense: 90 tablet; Refill: 1  BMI 35.0-35.9,adult   Diabetes under better control, A1c down from 13 three months ago. Continue to work on diet and exercise. Will add Ozempic  0.25mg  weekly. To see back in 6 weeks.  Blood pressure not at goal. Changed Losartan   50mg  daily to Losartan /hydrochlorothiazide 100/12.5mg  daily. To recheck in 6 weeks with labs. Advised pt to get blood pressure/glucose levels under better control before treating ED. Likely from uncontrolled diabetes and HTN. He is in agreement with plan.  No follow-ups on file.    Torrence CINDERELLA Barrier, MD

## 2023-09-04 ENCOUNTER — Other Ambulatory Visit (HOSPITAL_BASED_OUTPATIENT_CLINIC_OR_DEPARTMENT_OTHER): Payer: Self-pay

## 2023-09-19 ENCOUNTER — Other Ambulatory Visit: Payer: Self-pay | Admitting: Family Medicine

## 2023-09-19 DIAGNOSIS — E1165 Type 2 diabetes mellitus with hyperglycemia: Secondary | ICD-10-CM

## 2023-09-19 NOTE — Telephone Encounter (Signed)
 Copied from CRM [id number]. Topic: Clinical - Medication Refill >> Sep 19, 2023  3:44 PM Farrel B wrote: Most Recent Primary Care Visit:  Provider: COLETTE TORRENCE GRADE  Department: [facility]  Visit Type: OFFICE VISIT  Date: 08/16/2023  Medication:  atorvastatin  (LIPITOR) 20 MG tablet Semaglutide ,0.25 or 0.5MG /DOS, (OZEMPIC , 0.25 OR 0.5 MG/DOSE,) 2 MG/3ML SOPN  Has the patient contacted their pharmacy? Yes (Agent: If no, request that the patient contact the pharmacy for the refill. If patient does not wish to contact the pharmacy document the reason why and proceed with request.) (Agent: If yes, when and what did the pharmacy advise?)Needed to have it authorize  Is this the correct pharmacy for this prescription? Yes If no, delete pharmacy and type the correct one.  This is the patient's preferred pharmacy:  [facility] [address], [place] - [address] [place] [address] Phone: [phone] Fax: [phone]  [facility] [address] [place] [address] Phone: [phone] Fax: [phone]  [facility] [address] [place] - [address] [place] [address] Phone: [phone] Fax: [phone]   Has the prescription been filled recently? Yes  Is the patient out of the medication? Yes  Has the patient been seen for an appointment in the last year OR does the patient have an upcoming appointment? Yes  Can we respond through MyChart? Yes  Agent: Please be advised that Rx refills may take up to 3 business days. We ask that you follow-up with your pharmacy.

## 2023-09-19 NOTE — Telephone Encounter (Signed)
 Last Fill: Lipitor: 06/24/23     Semaglutide : 08/16/23  Last OV: 08/16/23 Next OV: 09/27/23  Routing to provider for review/authorization.

## 2023-09-20 MED ORDER — ATORVASTATIN CALCIUM 20 MG PO TABS: 20.0000 mg | ORAL_TABLET | Freq: Every day | ORAL | 1 refills | Status: DC

## 2023-09-20 MED ORDER — OZEMPIC (0.25 OR 0.5 MG/DOSE) 2 MG/3ML ~~LOC~~ SOPN: 0.2500 mg | PEN_INJECTOR | SUBCUTANEOUS | 1 refills | Status: AC

## 2023-09-23 NOTE — Telephone Encounter (Signed)
 Copied from CRM [id number]. Topic: Clinical - Prescription Issue >> Sep 23, 2023 12:08 PM Star East wrote: Reason for CRM: Semaglutide ,0.25 or 0.5MG /DOS, (OZEMPIC , 0.25 OR 0.5 MG/DOSE,) 2 MG/3ML SOPN  needs pre authorization per pharmacy, please call patient  [phone]

## 2023-09-27 ENCOUNTER — Telehealth: Payer: Self-pay

## 2023-09-27 ENCOUNTER — Ambulatory Visit: Payer: Medicaid Other | Admitting: Family Medicine

## 2023-09-27 ENCOUNTER — Encounter: Payer: Self-pay | Admitting: Family Medicine

## 2023-09-27 VITALS — BP 144/85 | HR 90 | Temp 98.2°F | Resp 16 | Ht 67.0 in | Wt 221.4 lb

## 2023-09-27 DIAGNOSIS — N529 Male erectile dysfunction, unspecified: Secondary | ICD-10-CM | POA: Diagnosis not present

## 2023-09-27 DIAGNOSIS — I10 Essential (primary) hypertension: Secondary | ICD-10-CM

## 2023-09-27 MED ORDER — SILDENAFIL CITRATE 20 MG PO TABS: 20.0000 mg | ORAL_TABLET | Freq: Every day | ORAL | 0 refills | Status: DC | PRN

## 2023-09-27 NOTE — Telephone Encounter (Signed)
Prior auth for: [facility] Determination: APPROVED Auth #: [id number] / [id number] Valid from: 09/27/23 - 09/26/24 Patient notified via MyChart

## 2023-09-27 NOTE — Progress Notes (Signed)
Established Patient Office Visit  Subjective   Patient ID: Larry Nixon, male    DOB: September 16, 1970  Age: 52 y.o. MRN: [id number]  Chief Complaint  Patient presents with   Hypertension   Erectile Dysfunction    Hypertension  Erectile Dysfunction  Hypertension Pt was seen on 1/3 for follow up. Had uncontrolled HTN and was changed on Losartan 50mg  daily to Losartan/hydrochlorothiazide 100/12.5mg  daily. Blood pressure better today. Not checking at home.  Erectile Dysfunction Pt mentioned ED last visit. He was advised to get blood pressure under better control before starting treatment for ED.  Review of Systems  All other systems reviewed and are negative.    Objective:     BP (!) 144/85   Pulse 90   Temp 98.2 F (36.8 C)   Resp 16   Ht 5\' 7"  (1.702 m)   Wt 221 lb 6.4 oz (100.4 kg)   SpO2 99%   BMI 34.68 kg/m  BP Readings from Last 3 Encounters:  09/27/23 (!) 144/85  08/16/23 (!) 166/99  07/19/23 (!) 145/89      Physical Exam Vitals and nursing note reviewed.  Constitutional:      Appearance: Normal appearance. He is normal weight.  HENT:     Head: Normocephalic and atraumatic.     Right Ear: External ear normal.     Left Ear: External ear normal.     Nose: Nose normal.     Mouth/Throat:     Mouth: Mucous membranes are moist.     Pharynx: Oropharynx is clear.  Eyes:     Conjunctiva/sclera: Conjunctivae normal.     Pupils: Pupils are equal, round, and reactive to light.  Cardiovascular:     Rate and Rhythm: Normal rate.  Pulmonary:     Effort: Pulmonary effort is normal.  Skin:    General: Skin is warm.     Capillary Refill: Capillary refill takes less than 2 seconds.  Neurological:     General: No focal deficit present.     Mental Status: He is alert and oriented to person, place, and time. Mental status is at baseline.  Psychiatric:        Mood and Affect: Mood normal.        Behavior: Behavior normal.        Thought Content: Thought content  normal.        Judgment: Judgment normal.    No results found for any visits on 09/27/23.  Last hemoglobin A1c Lab Results  Component Value Date   HGBA1C 9.5 (A) 08/16/2023      The ASCVD Risk score (Arnett DK, et al., 2019) failed to calculate for the following reasons:   The valid total cholesterol range is 130 to 320 mg/dL    Assessment & Plan:   Problem List Items Addressed This Visit       Cardiovascular and Mediastinum   Benign essential hypertension - Primary   Relevant Medications   sildenafil (REVATIO) 20 MG tablet   Other Relevant Orders   Comprehensive metabolic panel   Other Visit Diagnoses       Erectile dysfunction, unspecified erectile dysfunction type       Relevant Medications   sildenafil (REVATIO) 20 MG tablet      Benign essential hypertension -     Comprehensive metabolic panel  Erectile dysfunction, unspecified erectile dysfunction type -     Sildenafil Citrate; Take 1-3 tablets (20-60 mg total) by mouth daily as needed.  Dispense: 30 tablet; Refill:  0   Blood pressure much better. Continue Losartan/hydrochlorothiazide 100/12.5 mg daily.  Start Sildenafil 20 mg 1-3 tabs po daily prn for ED.  Return in about 7 weeks (around 11/18/2023) for Annual Physical.    Suzan Slick, MD

## 2023-09-28 LAB — COMPREHENSIVE METABOLIC PANEL
ALT: 22 [IU]/L (ref 0–44)
AST: 24 [IU]/L (ref 0–40)
Albumin: 4.7 g/dL (ref 3.8–4.9)
Alkaline Phosphatase: 140 [IU]/L — ABNORMAL HIGH (ref 44–121)
BUN/Creatinine Ratio: 13 (ref 9–20)
BUN: 13 mg/dL (ref 6–24)
Bilirubin Total: 1.3 mg/dL — ABNORMAL HIGH (ref 0.0–1.2)
CO2: 25 mmol/L (ref 20–29)
Calcium: 9.9 mg/dL (ref 8.7–10.2)
Chloride: 97 mmol/L (ref 96–106)
Creatinine, Ser: 1.04 mg/dL (ref 0.76–1.27)
Globulin, Total: 2.6 g/dL (ref 1.5–4.5)
Glucose: 305 mg/dL — ABNORMAL HIGH (ref 70–99)
Potassium: 4 mmol/L (ref 3.5–5.2)
Sodium: 140 mmol/L (ref 134–144)
Total Protein: 7.3 g/dL (ref 6.0–8.5)
eGFR: 87 mL/min/{1.73_m2} (ref >59)

## 2023-09-30 ENCOUNTER — Encounter: Payer: Self-pay | Admitting: Family Medicine

## 2023-10-07 ENCOUNTER — Other Ambulatory Visit (HOSPITAL_BASED_OUTPATIENT_CLINIC_OR_DEPARTMENT_OTHER): Payer: Self-pay

## 2023-10-28 ENCOUNTER — Other Ambulatory Visit (HOSPITAL_BASED_OUTPATIENT_CLINIC_OR_DEPARTMENT_OTHER): Payer: Self-pay

## 2023-10-28 ENCOUNTER — Other Ambulatory Visit: Payer: Self-pay

## 2023-11-15 ENCOUNTER — Encounter: Payer: Medicaid Other | Admitting: Family Medicine

## 2023-11-25 ENCOUNTER — Other Ambulatory Visit: Payer: Self-pay | Admitting: Family Medicine

## 2023-11-25 ENCOUNTER — Other Ambulatory Visit (HOSPITAL_BASED_OUTPATIENT_CLINIC_OR_DEPARTMENT_OTHER): Payer: Self-pay

## 2023-11-25 MED ORDER — INSULIN LISPRO PROT & LISPRO (75-25 MIX) 100 UNIT/ML KWIKPEN: 40.0000 [IU] | PEN_INJECTOR | Freq: Two times a day (BID) | SUBCUTANEOUS | 5 refills | Status: DC

## 2023-11-25 NOTE — Telephone Encounter (Signed)
 EPIC says there is already an order pending, however I dont see one. Unable to pend   Copied from CRM [id number]. Topic: Clinical - Medication Refill >> Nov 25, 2023  7:42 AM Baldemar Lev wrote: Most Recent Primary Care Visit:  Provider: Manette Section  Department: [facility]  Visit Type: OFFICE VISIT  Date: 09/27/2023  Medication: Insulin Lispro Prot & Lispro (HUMALOG 75/25 MIX) (75-25) 100 UNIT/ML Kwikpen  Has the patient contacted their pharmacy? Yes (Agent: If no, request that the patient contact the pharmacy for the refill. If patient does not wish to contact the pharmacy document the reason why and proceed with request.) (Agent: If yes, when and what did the pharmacy advise?)  Is this the correct pharmacy for this prescription? Yes If no, delete pharmacy and type the correct one.  This is the patient's preferred pharmacy:  [facility] [address] [place] - [address] [place] [address] Phone: [phone] Fax: [phone]   Has the prescription been filled recently? Yes  Is the patient out of the medication? Yes.  Has the patient been seen for an appointment in the last year OR does the patient have an upcoming appointment?   Can we respond through MyChart? Yes  Agent: Please be advised that Rx refills may take up to 3 business days. We ask that you follow-up with your pharmacy.

## 2023-11-25 NOTE — Telephone Encounter (Signed)
 Duplicate CRM and encounter. Please see refill request from earlier this morning.

## 2023-11-25 NOTE — Telephone Encounter (Signed)
 Copied from CRM [id number]. Topic: Clinical - Medication Refill >> Nov 25, 2023  9:49 AM Winnifred Havers wrote: Most Recent Primary Care Visit:  Provider: Manette Section  Department: [facility]  Visit Type: OFFICE VISIT  Date: 09/27/2023  Medication: Insulin Lispro Prot & Lispro (HUMALOG 75/25 MIX) (75-25) 100 UNIT/ML Kwikpen  Has the patient contacted their pharmacy? Yes (Agent: If no, request that the patient contact the pharmacy for the refill. If patient does not wish to contact the pharmacy document the reason why and proceed with request.) (Agent: If yes, when and what did the pharmacy advise?)  Is this the correct pharmacy for this prescription? Yes If no, delete pharmacy and type the correct one.  This is the patient's preferred pharmacy:  [facility] [address] [place] - [address] [place] [address] Phone: [phone] Fax: [phone]     Has the prescription been filled recently? Yes  Is the patient out of the medication? Yes  Has the patient been seen for an appointment in the last year OR does the patient have an upcoming appointment? Yes  Can we respond through MyChart? Yes  Agent: Please be advised that Rx refills may take up to 3 business days. We ask that you follow-up with your pharmacy.

## 2023-12-04 ENCOUNTER — Ambulatory Visit: Admitting: Family Medicine

## 2024-01-30 ENCOUNTER — Ambulatory Visit: Admitting: Family Medicine

## 2024-01-30 ENCOUNTER — Other Ambulatory Visit: Payer: Self-pay | Admitting: Family Medicine

## 2024-01-30 ENCOUNTER — Encounter: Payer: Self-pay | Admitting: Family Medicine

## 2024-01-30 VITALS — BP 132/86 | HR 95 | Temp 98.5°F | Resp 18 | Ht 67.0 in | Wt 220.5 lb

## 2024-01-30 DIAGNOSIS — R051 Acute cough: Secondary | ICD-10-CM | POA: Diagnosis not present

## 2024-01-30 DIAGNOSIS — J029 Acute pharyngitis, unspecified: Secondary | ICD-10-CM | POA: Diagnosis not present

## 2024-01-30 DIAGNOSIS — Z1159 Encounter for screening for other viral diseases: Secondary | ICD-10-CM

## 2024-01-30 DIAGNOSIS — E1110 Type 2 diabetes mellitus with ketoacidosis without coma: Secondary | ICD-10-CM

## 2024-01-30 DIAGNOSIS — N529 Male erectile dysfunction, unspecified: Secondary | ICD-10-CM

## 2024-01-30 LAB — POC COVID19 BINAXNOW: SARS Coronavirus 2 Ag: NEGATIVE

## 2024-01-30 LAB — POCT RAPID STREP A (OFFICE): Rapid Strep A Screen: NEGATIVE

## 2024-01-30 MED ORDER — LEVOTHYROXINE SODIUM 50 MCG PO TABS: 50.0000 ug | ORAL_TABLET | Freq: Every day | ORAL | 5 refills | Status: AC

## 2024-01-30 MED ORDER — AMOXICILLIN-POT CLAVULANATE 600-42.9 MG/5ML PO SUSR: 10.0000 mL | Freq: Two times a day (BID) | ORAL | 0 refills | Status: DC

## 2024-01-30 MED ORDER — GLIPIZIDE ER 10 MG PO TB24: 10.0000 mg | ORAL_TABLET | Freq: Two times a day (BID) | ORAL | 1 refills | Status: AC

## 2024-01-30 MED ORDER — SILDENAFIL CITRATE 100 MG PO TABS: 50.0000 mg | ORAL_TABLET | Freq: Every day | ORAL | 11 refills | Status: DC | PRN

## 2024-01-30 MED ORDER — INSULIN LISPRO PROT & LISPRO (75-25 MIX) 100 UNIT/ML KWIKPEN: 40.0000 [IU] | PEN_INJECTOR | Freq: Two times a day (BID) | SUBCUTANEOUS | 5 refills | Status: AC

## 2024-01-30 NOTE — Telephone Encounter (Signed)
 Copied from CRM [id number]. Topic: Clinical - Medication Refill >> Jan 30, 2024  9:38 AM [name] wrote: Medication: Insulin  Lispro Prot & Lispro (HUMALOG  75/25 MIX) (75-25) 100 UNIT/ML Kwikpen  Has the patient contacted their pharmacy? Yes (Agent: If no, request that the patient contact the pharmacy for the refill. If patient does not wish to contact the pharmacy document the reason why and proceed with request.) (Agent: If yes, when and what did the pharmacy advise?)  This is the patient's preferred pharmacy:  [facility] [address] [place] - [address] [place] [address] Phone: [phone] Fax: [phone]  Is this the correct pharmacy for this prescription? Yes If no, delete pharmacy and type the correct one.   Has the prescription been filled recently? No  Is the patient out of the medication? Yes  Has the patient been seen for an appointment in the last year OR does the patient have an upcoming appointment? Yes  Can we respond through MyChart? No  Agent: Please be advised that Rx refills may take up to 3 business days. We ask that you follow-up with your pharmacy.

## 2024-01-30 NOTE — Progress Notes (Signed)
 Acute Office Visit  Subjective:     Patient ID: Larry Nixon, male    DOB: July 12, 1971, 53 y.o.   MRN: [id number]  Chief Complaint  Patient presents with   Sore Throat    Patient has complaints of a sore throat that he has had for the past 2 days, he states that when his throat first began to hurt he saw white spots on the back of his throat. Patient also has complaints of a persistent cough that he states he has had for a while.     Sore Throat   Patient is in today for acute visit.  Sore throat Pt reports he's had sore throat for the last week. He reports the sore throat is gone away but his throat feels swollen when he swallows. He has mild cough. He has been doing throat lozenges that's helped some. She had white spots in the back of his throat.  ED He has been started on Sildenafil  20mg  1-3 tabs po daily prn. This hasn't helped. He would like to try another medicine to help with ED symptoms.    Review of Systems  HENT:  Positive for sore throat.   All other systems reviewed and are negative.       Objective:    BP 132/86   Pulse 95   Temp 98.5 F (36.9 C) (Oral)   Resp 18   Ht 5' 7 (1.702 m)   Wt 220 lb 8 oz (100 kg)   SpO2 99%   BMI 34.54 kg/m  BP Readings from Last 3 Encounters:  01/30/24 132/86  09/27/23 (!) 144/85  08/16/23 (!) 166/99      Physical Exam Vitals and nursing note reviewed.  Constitutional:      Appearance: Normal appearance. He is normal weight.  HENT:     Head: Normocephalic and atraumatic.     Right Ear: External ear normal.     Left Ear: External ear normal.     Nose: Nose normal.     Mouth/Throat:     Mouth: Mucous membranes are moist.     Pharynx: Oropharyngeal exudate present.     Comments: Erythematous pharynx with white ulcerations on uvula and palate  Eyes:     Conjunctiva/sclera: Conjunctivae normal.     Pupils: Pupils are equal, round, and reactive to light.    Cardiovascular:     Rate and Rhythm: Normal rate  and regular rhythm.     Pulses: Normal pulses.     Heart sounds: Normal heart sounds.  Pulmonary:     Effort: Pulmonary effort is normal.     Breath sounds: Normal breath sounds.  Abdominal:     General: Abdomen is flat. Bowel sounds are normal.   Skin:    General: Skin is warm.     Capillary Refill: Capillary refill takes less than 2 seconds.   Neurological:     General: No focal deficit present.     Mental Status: He is alert and oriented to person, place, and time. Mental status is at baseline.   Psychiatric:        Mood and Affect: Mood normal.        Behavior: Behavior normal.        Thought Content: Thought content normal.        Judgment: Judgment normal.    Results for orders placed or performed in visit on 01/30/24  POCT rapid strep A  Result Value Ref Range   Rapid Strep A Screen  Negative Negative  POC COVID-19 BinaxNow  Result Value Ref Range   SARS Coronavirus 2 Ag Negative Negative        Assessment & Plan:   Problem List Items Addressed This Visit   None Visit Diagnoses       Sore throat    -  Primary   Relevant Medications   amoxicillin-clavulanate (AUGMENTIN ES-600) 600-42.9 MG/5ML suspension   Other Relevant Orders   POCT rapid strep A (Completed)   POC COVID-19 BinaxNow (Completed)     Acute cough       Relevant Orders   POCT rapid strep A (Completed)   POC COVID-19 BinaxNow (Completed)     Screening for viral disease         Erectile dysfunction, unspecified erectile dysfunction type       Relevant Medications   sildenafil  (VIAGRA ) 100 MG tablet      Sore throat -     POCT rapid strep A -     POC COVID-19 BinaxNow -     Amoxicillin-Pot Clavulanate; Take 10 mLs by mouth 2 (two) times daily.  Dispense: 200 mL; Refill: 0  Acute cough -     POCT rapid strep A -     POC COVID-19 BinaxNow  Screening for viral disease  Erectile dysfunction, unspecified erectile dysfunction type -     Sildenafil  Citrate; Take 0.5-1 tablets (50-100 mg  total) by mouth daily as needed for erectile dysfunction.  Dispense: 5 tablet; Refill: 11   Pt with sore throat. Negative strep and covid. Has evidence of ulcerations in throat. To do viral culture added. Pt also reports the Sildenafil  20mg  1-3 tabs po daily prn didn't resolve his ED symptoms. To change to Sildenafil  100mg  to use 1/2 tab po -1 tab po daily prn.  To follow up in 1 week.  Meds ordered this encounter  Medications   amoxicillin-clavulanate (AUGMENTIN ES-600) 600-42.9 MG/5ML suspension    Sig: Take 10 mLs by mouth 2 (two) times daily.    Dispense:  200 mL    Refill:  0   sildenafil  (VIAGRA ) 100 MG tablet    Sig: Take 0.5-1 tablets (50-100 mg total) by mouth daily as needed for erectile dysfunction.    Dispense:  5 tablet    Refill:  11    Return in about 1 week (around 02/06/2024) for Sore throat.  Manette Section, MD

## 2024-01-30 NOTE — Telephone Encounter (Unsigned)
 Copied from CRM [id number]. Topic: Clinical - Medication Refill >> Jan 30, 2024  9:46 AM [name] wrote: Medication: glipiZIDE  (GLUCOTROL  XL) 10 MG 24 hr tablet, levothyroxine  (SYNTHROID ) 50 MCG tablet  Has the patient contacted their pharmacy? No (Agent: If no, request that the patient contact the pharmacy for the refill. If patient does not wish to contact the pharmacy document the reason why and proceed with request.) (Agent: If yes, when and what did the pharmacy advise?)  This is the patient's preferred pharmacy:  [facility] [address] [place] - [address] [place] [address] Phone: [phone] Fax: [phone]  Is this the correct pharmacy for this prescription? Yes If no, delete pharmacy and type the correct one.   Has the prescription been filled recently? No  Is the patient out of the medication? No  Has the patient been seen for an appointment in the last year OR does the patient have an upcoming appointment? Yes  Can we respond through MyChart? No  Agent: Please be advised that Rx refills may take up to 3 business days. We ask that you follow-up with your pharmacy.

## 2024-01-31 ENCOUNTER — Encounter: Payer: Self-pay | Admitting: Family Medicine

## 2024-01-31 DIAGNOSIS — R051 Acute cough: Secondary | ICD-10-CM

## 2024-01-31 DIAGNOSIS — J029 Acute pharyngitis, unspecified: Secondary | ICD-10-CM

## 2024-01-31 MED ORDER — AMOXICILLIN-POT CLAVULANATE 875-125 MG PO TABS: 1.0000 | ORAL_TABLET | Freq: Two times a day (BID) | ORAL | 0 refills | Status: AC

## 2024-02-06 ENCOUNTER — Telehealth: Payer: Self-pay

## 2024-02-06 ENCOUNTER — Ambulatory Visit: Payer: Self-pay | Admitting: Family Medicine

## 2024-02-06 ENCOUNTER — Ambulatory Visit: Admitting: Family Medicine

## 2024-02-06 DIAGNOSIS — Z1211 Encounter for screening for malignant neoplasm of colon: Secondary | ICD-10-CM

## 2024-02-06 LAB — VIRUS CULTURE

## 2024-02-06 NOTE — Telephone Encounter (Signed)
 Copied from CRM [id number]. Topic: Appointments - Appointment Info/Confirmation >> Feb 06, 2024 11:13 AM Marissa P wrote: Patient/patient representative is calling for information regarding an appointment.  He will be about ten minutes late to his appt today

## 2024-02-06 NOTE — Telephone Encounter (Signed)
 Pt stated that he will call back within 2 weeks to reschedule.

## 2024-03-01 ENCOUNTER — Other Ambulatory Visit: Payer: Self-pay | Admitting: Family Medicine

## 2024-03-01 DIAGNOSIS — I10 Essential (primary) hypertension: Secondary | ICD-10-CM

## 2024-04-28 ENCOUNTER — Encounter (INDEPENDENT_AMBULATORY_CARE_PROVIDER_SITE_OTHER): Payer: Self-pay | Admitting: Family Medicine

## 2024-04-28 ENCOUNTER — Other Ambulatory Visit: Payer: Self-pay | Admitting: Family Medicine

## 2024-04-28 DIAGNOSIS — N529 Male erectile dysfunction, unspecified: Secondary | ICD-10-CM

## 2024-04-28 DIAGNOSIS — B370 Candidal stomatitis: Secondary | ICD-10-CM

## 2024-04-28 MED ORDER — CLOTRIMAZOLE 10 MG MT TROC: 10.0000 mg | Freq: Every day | OROMUCOSAL | 0 refills | Status: AC

## 2024-04-28 MED ORDER — SILDENAFIL CITRATE 100 MG PO TABS: 50.0000 mg | ORAL_TABLET | Freq: Every day | ORAL | 0 refills | Status: AC | PRN

## 2024-04-28 NOTE — Addendum Note (Signed)
 Addended by: COLETTE TORRENCE GRADE on: 04/28/2024 02:26 PM   Modules accepted: Orders

## 2024-04-28 NOTE — Addendum Note (Signed)
 Addended by: COLETTE TORRENCE GRADE on: 04/28/2024 03:46 PM   Modules accepted: Orders

## 2024-04-28 NOTE — Telephone Encounter (Signed)
 Pt contacted provider with symptoms of oral thrush. Pt sent picture for evaluation and reviewed by provider. Pt is uncontrolled diabetic. Sent Clotrimazole  for 7 days for oral thrush and advised pt on following up soon for Diabetes. Last A1c 9.18 Aug 2023.  I personally spent a total of 5 minutes in the care of the patient today including performing a medically appropriate exam/evaluation, counseling and educating, placing orders, documenting clinical information in the EHR, and coordinating care.

## 2024-04-29 NOTE — Telephone Encounter (Signed)
 Already filled yesterday

## 2024-08-17 ENCOUNTER — Ambulatory Visit: Admitting: Family Medicine

## 2024-08-25 ENCOUNTER — Other Ambulatory Visit: Payer: Self-pay | Admitting: Family Medicine

## 2024-09-14 ENCOUNTER — Other Ambulatory Visit (HOSPITAL_COMMUNITY): Payer: Self-pay
